# Patient Record
Sex: Female | Born: 1980
Health system: Southern US, Community
[De-identification: ages and names within clinical notes are randomized; demographics above are authoritative.]

## PROBLEM LIST (undated history)

## (undated) DIAGNOSIS — J302 Other seasonal allergic rhinitis: Secondary | ICD-10-CM

## (undated) DIAGNOSIS — I9789 Other postprocedural complications and disorders of the circulatory system, not elsewhere classified: Secondary | ICD-10-CM

## (undated) DIAGNOSIS — Z9289 Personal history of other medical treatment: Secondary | ICD-10-CM

## (undated) DIAGNOSIS — Z952 Presence of prosthetic heart valve: Secondary | ICD-10-CM

## (undated) DIAGNOSIS — R030 Elevated blood-pressure reading, without diagnosis of hypertension: Secondary | ICD-10-CM

## (undated) DIAGNOSIS — I091 Rheumatic diseases of endocardium, valve unspecified: Secondary | ICD-10-CM

## (undated) DIAGNOSIS — I4891 Unspecified atrial fibrillation: Secondary | ICD-10-CM

## (undated) HISTORY — DX: Personal history of other medical treatment: Z92.89

## (undated) HISTORY — DX: Unspecified atrial fibrillation: I48.91

## (undated) HISTORY — DX: Other seasonal allergic rhinitis: J30.2

## (undated) HISTORY — DX: Rheumatic diseases of endocardium, valve unspecified: I09.1

## (undated) HISTORY — DX: Unspecified atrial fibrillation: I97.89

## (undated) HISTORY — DX: Elevated blood-pressure reading, without diagnosis of hypertension: R03.0

## (undated) HISTORY — PX: TRANSTHORACIC ECHOCARDIOGRAM: SHX275

---
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
## 2006-11-30 ENCOUNTER — Ambulatory Visit (HOSPITAL_COMMUNITY): Admission: RE | Admit: 2006-11-30 | Discharge: 2006-11-30 | Payer: Self-pay | Admitting: Obstetrics and Gynecology

## 2006-12-27 ENCOUNTER — Ambulatory Visit (HOSPITAL_COMMUNITY): Admission: RE | Admit: 2006-12-27 | Discharge: 2006-12-27 | Payer: Self-pay | Admitting: Obstetrics and Gynecology

## 2007-02-14 ENCOUNTER — Ambulatory Visit (HOSPITAL_COMMUNITY): Admission: RE | Admit: 2007-02-14 | Discharge: 2007-02-14 | Payer: Self-pay | Admitting: Obstetrics and Gynecology

## 2007-03-14 ENCOUNTER — Ambulatory Visit (HOSPITAL_COMMUNITY): Admission: RE | Admit: 2007-03-14 | Discharge: 2007-03-14 | Payer: Self-pay | Admitting: Obstetrics and Gynecology

## 2008-05-15 ENCOUNTER — Ambulatory Visit (HOSPITAL_COMMUNITY): Admission: RE | Admit: 2008-05-15 | Discharge: 2008-05-15 | Payer: Self-pay | Admitting: Obstetrics and Gynecology

## 2008-09-14 DIAGNOSIS — Z952 Presence of prosthetic heart valve: Secondary | ICD-10-CM

## 2008-09-14 HISTORY — DX: Presence of prosthetic heart valve: Z95.2

## 2008-10-09 ENCOUNTER — Inpatient Hospital Stay (HOSPITAL_COMMUNITY): Admission: AD | Admit: 2008-10-09 | Discharge: 2008-10-09 | Payer: Self-pay | Admitting: Obstetrics and Gynecology

## 2008-10-21 ENCOUNTER — Inpatient Hospital Stay (HOSPITAL_COMMUNITY): Admission: AD | Admit: 2008-10-21 | Discharge: 2008-10-24 | Payer: Self-pay | Admitting: Obstetrics and Gynecology

## 2008-10-22 ENCOUNTER — Encounter (INDEPENDENT_AMBULATORY_CARE_PROVIDER_SITE_OTHER): Payer: Self-pay | Admitting: Internal Medicine

## 2008-10-26 ENCOUNTER — Encounter: Payer: Self-pay | Admitting: Obstetrics and Gynecology

## 2008-10-26 ENCOUNTER — Observation Stay (HOSPITAL_COMMUNITY): Admission: AD | Admit: 2008-10-26 | Discharge: 2008-10-27 | Payer: Self-pay | Admitting: Obstetrics and Gynecology

## 2008-11-01 ENCOUNTER — Encounter: Payer: Self-pay | Admitting: Obstetrics and Gynecology

## 2008-11-01 ENCOUNTER — Ambulatory Visit (HOSPITAL_COMMUNITY): Admission: RE | Admit: 2008-11-01 | Discharge: 2008-11-01 | Payer: Self-pay | Admitting: Obstetrics and Gynecology

## 2009-01-07 ENCOUNTER — Encounter (INDEPENDENT_AMBULATORY_CARE_PROVIDER_SITE_OTHER): Payer: Self-pay | Admitting: Cardiology

## 2009-01-07 ENCOUNTER — Ambulatory Visit (HOSPITAL_COMMUNITY): Admission: RE | Admit: 2009-01-07 | Discharge: 2009-01-07 | Payer: Self-pay | Admitting: Cardiology

## 2009-02-04 ENCOUNTER — Ambulatory Visit: Payer: Self-pay | Admitting: Thoracic Surgery (Cardiothoracic Vascular Surgery)

## 2009-02-12 HISTORY — PX: CARDIAC CATHETERIZATION: SHX172

## 2009-02-18 ENCOUNTER — Ambulatory Visit (HOSPITAL_COMMUNITY): Admission: RE | Admit: 2009-02-18 | Discharge: 2009-02-18 | Payer: Self-pay | Admitting: Cardiology

## 2009-02-25 ENCOUNTER — Ambulatory Visit: Payer: Self-pay | Admitting: Thoracic Surgery (Cardiothoracic Vascular Surgery)

## 2009-03-14 HISTORY — PX: OTHER SURGICAL HISTORY: SHX169

## 2009-03-25 ENCOUNTER — Ambulatory Visit: Payer: Self-pay | Admitting: Thoracic Surgery (Cardiothoracic Vascular Surgery)

## 2009-03-26 ENCOUNTER — Encounter: Payer: Self-pay | Admitting: Thoracic Surgery (Cardiothoracic Vascular Surgery)

## 2009-03-26 ENCOUNTER — Inpatient Hospital Stay (HOSPITAL_COMMUNITY)
Admission: RE | Admit: 2009-03-26 | Discharge: 2009-04-02 | Payer: Self-pay | Admitting: Thoracic Surgery (Cardiothoracic Vascular Surgery)

## 2009-03-26 ENCOUNTER — Ambulatory Visit: Payer: Self-pay | Admitting: Thoracic Surgery (Cardiothoracic Vascular Surgery)

## 2009-04-03 ENCOUNTER — Inpatient Hospital Stay (HOSPITAL_COMMUNITY): Admission: EM | Admit: 2009-04-03 | Discharge: 2009-04-05 | Payer: Self-pay | Admitting: Emergency Medicine

## 2009-04-15 ENCOUNTER — Ambulatory Visit: Payer: Self-pay | Admitting: Thoracic Surgery (Cardiothoracic Vascular Surgery)

## 2009-04-15 ENCOUNTER — Encounter
Admission: RE | Admit: 2009-04-15 | Discharge: 2009-04-15 | Payer: Self-pay | Admitting: Thoracic Surgery (Cardiothoracic Vascular Surgery)

## 2009-06-24 ENCOUNTER — Ambulatory Visit: Payer: Self-pay | Admitting: Thoracic Surgery (Cardiothoracic Vascular Surgery)

## 2009-12-17 ENCOUNTER — Ambulatory Visit: Payer: Self-pay | Admitting: Oncology

## 2010-12-21 LAB — POCT I-STAT 4, (NA,K, GLUC, HGB,HCT)
Glucose, Bld: 106 mg/dL — ABNORMAL HIGH (ref 70–99)
Glucose, Bld: 107 mg/dL — ABNORMAL HIGH (ref 70–99)
Glucose, Bld: 126 mg/dL — ABNORMAL HIGH (ref 70–99)
Glucose, Bld: 144 mg/dL — ABNORMAL HIGH (ref 70–99)
Glucose, Bld: 236 mg/dL — ABNORMAL HIGH (ref 70–99)
Glucose, Bld: 81 mg/dL (ref 70–99)
Glucose, Bld: 85 mg/dL (ref 70–99)
Glucose, Bld: 87 mg/dL (ref 70–99)
Glucose, Bld: 88 mg/dL (ref 70–99)
HCT: 20 % — ABNORMAL LOW (ref 36.0–46.0)
HCT: 22 % — ABNORMAL LOW (ref 36.0–46.0)
HCT: 23 % — ABNORMAL LOW (ref 36.0–46.0)
HCT: 23 % — ABNORMAL LOW (ref 36.0–46.0)
HCT: 32 % — ABNORMAL LOW (ref 36.0–46.0)
HCT: 34 % — ABNORMAL LOW (ref 36.0–46.0)
HCT: 38 % (ref 36.0–46.0)
Hemoglobin: 10.9 g/dL — ABNORMAL LOW (ref 12.0–15.0)
Hemoglobin: 11.6 g/dL — ABNORMAL LOW (ref 12.0–15.0)
Hemoglobin: 14.3 g/dL (ref 12.0–15.0)
Hemoglobin: 6.8 g/dL — CL (ref 12.0–15.0)
Hemoglobin: 6.8 g/dL — CL (ref 12.0–15.0)
Hemoglobin: 7.8 g/dL — CL (ref 12.0–15.0)
Hemoglobin: 7.8 g/dL — CL (ref 12.0–15.0)
Potassium: 2.6 mEq/L — CL (ref 3.5–5.1)
Potassium: 3 mEq/L — ABNORMAL LOW (ref 3.5–5.1)
Potassium: 3.1 mEq/L — ABNORMAL LOW (ref 3.5–5.1)
Potassium: 3.2 mEq/L — ABNORMAL LOW (ref 3.5–5.1)
Potassium: 3.7 mEq/L (ref 3.5–5.1)
Potassium: 3.8 mEq/L (ref 3.5–5.1)
Potassium: 3.8 mEq/L (ref 3.5–5.1)
Potassium: 4.5 mEq/L (ref 3.5–5.1)
Potassium: 5 mEq/L (ref 3.5–5.1)
Potassium: 5.1 mEq/L (ref 3.5–5.1)
Sodium: 136 mEq/L (ref 135–145)
Sodium: 138 mEq/L (ref 135–145)
Sodium: 140 mEq/L (ref 135–145)
Sodium: 140 mEq/L (ref 135–145)
Sodium: 144 mEq/L (ref 135–145)
Sodium: 145 mEq/L (ref 135–145)

## 2010-12-21 LAB — URINALYSIS, DIPSTICK ONLY
Bilirubin Urine: NEGATIVE
Glucose, UA: NEGATIVE mg/dL
Hgb urine dipstick: NEGATIVE
Specific Gravity, Urine: 1.014 (ref 1.005–1.030)
Urobilinogen, UA: >8 mg/dL — ABNORMAL HIGH (ref 0.0–1.0)
pH: 7 (ref 5.0–8.0)

## 2010-12-21 LAB — CBC
HCT: 31.3 % — ABNORMAL LOW (ref 36.0–46.0)
HCT: 35 % — ABNORMAL LOW (ref 36.0–46.0)
HCT: 35.7 % — ABNORMAL LOW (ref 36.0–46.0)
HCT: 10.2 % — ABNORMAL LOW (ref 36.0–46.0)
HCT: 24.2 % — ABNORMAL LOW (ref 36.0–46.0)
HCT: 25.6 % — ABNORMAL LOW (ref 36.0–46.0)
HCT: 34.3 % — ABNORMAL LOW (ref 36.0–46.0)
HCT: 38.4 % (ref 36.0–46.0)
HCT: 39.7 % (ref 36.0–46.0)
Hemoglobin: 10.8 g/dL — ABNORMAL LOW (ref 12.0–15.0)
Hemoglobin: 12.1 g/dL (ref 12.0–15.0)
Hemoglobin: 12.1 g/dL (ref 12.0–15.0)
Hemoglobin: 12.6 g/dL (ref 12.0–15.0)
Hemoglobin: 12.7 g/dL (ref 12.0–15.0)
Hemoglobin: 13.4 g/dL (ref 12.0–15.0)
Hemoglobin: 3.6 g/dL — CL (ref 12.0–15.0)
Hemoglobin: 8.4 g/dL — ABNORMAL LOW (ref 12.0–15.0)
Hemoglobin: 9 g/dL — ABNORMAL LOW (ref 12.0–15.0)
MCHC: 33.9 g/dL (ref 30.0–36.0)
MCHC: 34.3 g/dL (ref 30.0–36.0)
MCHC: 34.5 g/dL (ref 30.0–36.0)
MCHC: 34.7 g/dL (ref 30.0–36.0)
MCHC: 35.1 g/dL (ref 30.0–36.0)
MCHC: 35.2 g/dL (ref 30.0–36.0)
MCV: 85.9 fL (ref 78.0–100.0)
MCV: 86.1 fL (ref 78.0–100.0)
MCV: 85 fL (ref 78.0–100.0)
MCV: 85.2 fL (ref 78.0–100.0)
MCV: 86.6 fL (ref 78.0–100.0)
Platelets: 69 10*3/uL — ABNORMAL LOW (ref 150–400)
Platelets: 111 10*3/uL — ABNORMAL LOW (ref 150–400)
Platelets: 166 10*3/uL (ref 150–400)
Platelets: 88 10*3/uL — ABNORMAL LOW (ref 150–400)
Platelets: 98 10*3/uL — ABNORMAL LOW (ref 150–400)
RBC: 4.07 MIL/uL (ref 3.87–5.11)
RBC: 4.1 MIL/uL (ref 3.87–5.11)
RBC: 4.12 MIL/uL (ref 3.87–5.11)
RBC: 2.84 MIL/uL — ABNORMAL LOW (ref 3.87–5.11)
RBC: 3.01 MIL/uL — ABNORMAL LOW (ref 3.87–5.11)
RBC: 4.02 MIL/uL (ref 3.87–5.11)
RBC: 4.36 MIL/uL (ref 3.87–5.11)
RBC: 4.69 MIL/uL (ref 3.87–5.11)
RDW: 16.6 % — ABNORMAL HIGH (ref 11.5–15.5)
RDW: 15.5 % (ref 11.5–15.5)
RDW: 15.8 % — ABNORMAL HIGH (ref 11.5–15.5)
RDW: 16.2 % — ABNORMAL HIGH (ref 11.5–15.5)
RDW: 17.3 % — ABNORMAL HIGH (ref 11.5–15.5)
RDW: 17.4 % — ABNORMAL HIGH (ref 11.5–15.5)
WBC: 6.2 10*3/uL (ref 4.0–10.5)
WBC: 7.7 10*3/uL (ref 4.0–10.5)
WBC: 8 10*3/uL (ref 4.0–10.5)
WBC: 14.3 10*3/uL — ABNORMAL HIGH (ref 4.0–10.5)
WBC: 4.6 10*3/uL (ref 4.0–10.5)

## 2010-12-21 LAB — POCT I-STAT 3, ART BLOOD GAS (G3+)
Acid-base deficit: 1 mmol/L (ref 0.0–2.0)
Acid-base deficit: 1 mmol/L (ref 0.0–2.0)
Acid-base deficit: 2 mmol/L (ref 0.0–2.0)
Acid-base deficit: 3 mmol/L — ABNORMAL HIGH (ref 0.0–2.0)
Acid-base deficit: 4 mmol/L — ABNORMAL HIGH (ref 0.0–2.0)
Bicarbonate: 21.4 mEq/L (ref 20.0–24.0)
Bicarbonate: 23.1 mEq/L (ref 20.0–24.0)
Bicarbonate: 24.4 mEq/L — ABNORMAL HIGH (ref 20.0–24.0)
Bicarbonate: 25.1 mEq/L — ABNORMAL HIGH (ref 20.0–24.0)
Bicarbonate: 25.1 mEq/L — ABNORMAL HIGH (ref 20.0–24.0)
Bicarbonate: 25.2 mEq/L — ABNORMAL HIGH (ref 20.0–24.0)
O2 Saturation: 100 %
O2 Saturation: 100 %
O2 Saturation: 100 %
O2 Saturation: 100 %
O2 Saturation: 97 %
O2 Saturation: 99 %
O2 Saturation: 99 %
Patient temperature: 37.7
TCO2: 23 mmol/L (ref 0–100)
TCO2: 24 mmol/L (ref 0–100)
TCO2: 24 mmol/L (ref 0–100)
TCO2: 26 mmol/L (ref 0–100)
TCO2: 26 mmol/L (ref 0–100)
TCO2: 27 mmol/L (ref 0–100)
pCO2 arterial: 37.7 mmHg (ref 35.0–45.0)
pCO2 arterial: 38 mmHg (ref 35.0–45.0)
pCO2 arterial: 47.1 mmHg — ABNORMAL HIGH (ref 35.0–45.0)
pCO2 arterial: 47.2 mmHg — ABNORMAL HIGH (ref 35.0–45.0)
pCO2 arterial: 47.7 mmHg — ABNORMAL HIGH (ref 35.0–45.0)
pCO2 arterial: 49.8 mmHg — ABNORMAL HIGH (ref 35.0–45.0)
pH, Arterial: 7.317 — ABNORMAL LOW (ref 7.350–7.400)
pH, Arterial: 7.325 — ABNORMAL LOW (ref 7.350–7.400)
pH, Arterial: 7.329 — ABNORMAL LOW (ref 7.350–7.400)
pH, Arterial: 7.352 (ref 7.350–7.400)
pO2, Arterial: 120 mmHg — ABNORMAL HIGH (ref 80.0–100.0)
pO2, Arterial: 157 mmHg — ABNORMAL HIGH (ref 80.0–100.0)
pO2, Arterial: 161 mmHg — ABNORMAL HIGH (ref 80.0–100.0)
pO2, Arterial: 238 mmHg — ABNORMAL HIGH (ref 80.0–100.0)
pO2, Arterial: 337 mmHg — ABNORMAL HIGH (ref 80.0–100.0)
pO2, Arterial: 351 mmHg — ABNORMAL HIGH (ref 80.0–100.0)
pO2, Arterial: 97 mmHg (ref 80.0–100.0)

## 2010-12-21 LAB — BASIC METABOLIC PANEL
BUN: 10 mg/dL (ref 6–23)
BUN: 11 mg/dL (ref 6–23)
BUN: 12 mg/dL (ref 6–23)
CO2: 26 mEq/L (ref 19–32)
CO2: 30 mEq/L (ref 19–32)
CO2: 32 mEq/L (ref 19–32)
Calcium: 8.3 mg/dL — ABNORMAL LOW (ref 8.4–10.5)
Calcium: 8.6 mg/dL (ref 8.4–10.5)
Calcium: 9.1 mg/dL (ref 8.4–10.5)
Calcium: 9.1 mg/dL (ref 8.4–10.5)
Calcium: 7.8 mg/dL — ABNORMAL LOW (ref 8.4–10.5)
Chloride: 96 mEq/L (ref 96–112)
Chloride: 97 mEq/L (ref 96–112)
Chloride: 101 mEq/L (ref 96–112)
Creatinine, Ser: 0.5 mg/dL (ref 0.4–1.2)
Creatinine, Ser: 0.7 mg/dL (ref 0.4–1.2)
Creatinine, Ser: 0.61 mg/dL (ref 0.4–1.2)
Creatinine, Ser: 0.68 mg/dL (ref 0.4–1.2)
GFR calc Af Amer: >60 mL/min (ref >60)
GFR calc Af Amer: >60 mL/min (ref >60)
GFR calc Af Amer: >60 mL/min (ref >60)
GFR calc Af Amer: >60 mL/min (ref >60)
GFR calc Af Amer: >60 mL/min (ref >60)
GFR calc Af Amer: >60 mL/min (ref >60)
GFR calc non Af Amer: >60 mL/min (ref >60)
GFR calc non Af Amer: >60 mL/min (ref >60)
GFR calc non Af Amer: >60 mL/min (ref >60)
GFR calc non Af Amer: >60 mL/min (ref >60)
GFR calc non Af Amer: >60 mL/min (ref >60)
Glucose, Bld: 116 mg/dL — ABNORMAL HIGH (ref 70–99)
Glucose, Bld: 119 mg/dL — ABNORMAL HIGH (ref 70–99)
Glucose, Bld: 121 mg/dL — ABNORMAL HIGH (ref 70–99)
Potassium: 3.4 mEq/L — ABNORMAL LOW (ref 3.5–5.1)
Potassium: 3.6 mEq/L (ref 3.5–5.1)
Potassium: 3.7 mEq/L (ref 3.5–5.1)
Sodium: 133 mEq/L — ABNORMAL LOW (ref 135–145)
Sodium: 135 mEq/L (ref 135–145)
Sodium: 136 mEq/L (ref 135–145)
Sodium: 138 mEq/L (ref 135–145)

## 2010-12-21 LAB — BLOOD GAS, ARTERIAL
Drawn by: 181601
Patient temperature: 98.6
pCO2 arterial: 37.4 mmHg (ref 35.0–45.0)
pH, Arterial: 7.382 (ref 7.350–7.400)

## 2010-12-21 LAB — BRAIN NATRIURETIC PEPTIDE: Pro B Natriuretic peptide (BNP): 541 pg/mL — ABNORMAL HIGH (ref 0.0–100.0)

## 2010-12-21 LAB — PREPARE PLATELETS

## 2010-12-21 LAB — POCT I-STAT, CHEM 8
BUN: 14 mg/dL (ref 6–23)
Calcium, Ion: 1.1 mmol/L — ABNORMAL LOW (ref 1.12–1.32)
HCT: 35 % — ABNORMAL LOW (ref 36.0–46.0)
Hemoglobin: 11.9 g/dL — ABNORMAL LOW (ref 12.0–15.0)
Potassium: 3.7 mEq/L (ref 3.5–5.1)
Sodium: 142 mEq/L (ref 135–145)
TCO2: 20 mmol/L (ref 0–100)

## 2010-12-21 LAB — PREPARE FRESH FROZEN PLASMA

## 2010-12-21 LAB — GLUCOSE, CAPILLARY
Glucose-Capillary: 116 mg/dL — ABNORMAL HIGH (ref 70–99)
Glucose-Capillary: 90 mg/dL (ref 70–99)
Glucose-Capillary: 103 mg/dL — ABNORMAL HIGH (ref 70–99)
Glucose-Capillary: 109 mg/dL — ABNORMAL HIGH (ref 70–99)
Glucose-Capillary: 109 mg/dL — ABNORMAL HIGH (ref 70–99)
Glucose-Capillary: 120 mg/dL — ABNORMAL HIGH (ref 70–99)
Glucose-Capillary: 133 mg/dL — ABNORMAL HIGH (ref 70–99)
Glucose-Capillary: 140 mg/dL — ABNORMAL HIGH (ref 70–99)
Glucose-Capillary: 142 mg/dL — ABNORMAL HIGH (ref 70–99)

## 2010-12-21 LAB — CULTURE, BLOOD (ROUTINE X 2)
Culture: NO GROWTH
Culture: NO GROWTH

## 2010-12-21 LAB — PROTIME-INR
INR: 1.1 (ref 0.00–1.49)
INR: 1.2 (ref 0.00–1.49)
INR: 1.5 (ref 0.00–1.49)
INR: 1.5 (ref 0.00–1.49)
INR: 1.6 — ABNORMAL HIGH (ref 0.00–1.49)
INR: 1 (ref 0.00–1.49)
INR: 1.3 (ref 0.00–1.49)
INR: 1.5 (ref 0.00–1.49)
Prothrombin Time: 14.6 seconds (ref 11.6–15.2)
Prothrombin Time: 14.8 seconds (ref 11.6–15.2)
Prothrombin Time: 15.8 seconds — ABNORMAL HIGH (ref 11.6–15.2)
Prothrombin Time: 17.7 seconds — ABNORMAL HIGH (ref 11.6–15.2)
Prothrombin Time: 19.8 seconds — ABNORMAL HIGH (ref 11.6–15.2)
Prothrombin Time: 19.2 seconds — ABNORMAL HIGH (ref 11.6–15.2)

## 2010-12-21 LAB — TYPE AND SCREEN
ABO/RH(D): B POS
Antibody Screen: NEGATIVE

## 2010-12-21 LAB — COMPREHENSIVE METABOLIC PANEL
ALT: 13 U/L (ref 0–35)
AST: 15 U/L (ref 0–37)
Albumin: 4 g/dL (ref 3.5–5.2)
Alkaline Phosphatase: 52 U/L (ref 39–117)
BUN: 15 mg/dL (ref 6–23)
GFR calc Af Amer: >60 mL/min (ref >60)
Potassium: 4.1 mEq/L (ref 3.5–5.1)
Sodium: 136 mEq/L (ref 135–145)
Total Protein: 7.2 g/dL (ref 6.0–8.3)

## 2010-12-21 LAB — HEMOGLOBIN A1C
Hgb A1c MFr Bld: 5.3 % (ref 4.6–6.1)
Mean Plasma Glucose: 105 mg/dL

## 2010-12-21 LAB — CREATININE, SERUM
Creatinine, Ser: 0.67 mg/dL (ref 0.4–1.2)
GFR calc non Af Amer: >60 mL/min (ref >60)

## 2010-12-21 LAB — POCT I-STAT 3, VENOUS BLOOD GAS (G3P V)
Acid-base deficit: 3 mmol/L — ABNORMAL HIGH (ref 0.0–2.0)
Acid-base deficit: 3 mmol/L — ABNORMAL HIGH (ref 0.0–2.0)
Bicarbonate: 24 mEq/L (ref 20.0–24.0)
O2 Saturation: 75 %
O2 Saturation: 82 %
TCO2: 26 mmol/L (ref 0–100)
TCO2: 26 mmol/L (ref 0–100)
pO2, Ven: 47 mmHg — ABNORMAL HIGH (ref 30.0–45.0)
pO2, Ven: 53 mmHg — ABNORMAL HIGH (ref 30.0–45.0)

## 2010-12-21 LAB — URINE CULTURE

## 2010-12-21 LAB — URINALYSIS, ROUTINE W REFLEX MICROSCOPIC
Hgb urine dipstick: NEGATIVE
Specific Gravity, Urine: 1.012 (ref 1.005–1.030)
Urobilinogen, UA: 0.2 mg/dL (ref 0.0–1.0)

## 2010-12-21 LAB — DIFFERENTIAL
Basophils Absolute: 0 10*3/uL (ref 0.0–0.1)
Basophils Relative: 0 % (ref 0–1)
Lymphocytes Relative: 7 % — ABNORMAL LOW (ref 12–46)
Monocytes Absolute: 0.9 10*3/uL (ref 0.1–1.0)
Neutro Abs: 7.1 10*3/uL (ref 1.7–7.7)
Neutrophils Relative %: 83 % — ABNORMAL HIGH (ref 43–77)

## 2010-12-21 LAB — CK TOTAL AND CKMB (NOT AT ARMC)
CK, MB: 1.9 ng/mL (ref 0.3–4.0)
Relative Index: INVALID (ref 0.0–2.5)
Total CK: 54 U/L (ref 7–177)

## 2010-12-21 LAB — MAGNESIUM
Magnesium: 1.9 mg/dL (ref 1.5–2.5)
Magnesium: 2.2 mg/dL (ref 1.5–2.5)
Magnesium: 1.7 mg/dL (ref 1.5–2.5)
Magnesium: 1.9 mg/dL (ref 1.5–2.5)

## 2010-12-21 LAB — ABO/RH: ABO/RH(D): B POS

## 2010-12-21 LAB — HEMOGLOBIN AND HEMATOCRIT, BLOOD
HCT: 24.8 % — ABNORMAL LOW (ref 36.0–46.0)
Hemoglobin: 8.4 g/dL — ABNORMAL LOW (ref 12.0–15.0)

## 2010-12-21 LAB — APTT
aPTT: 41 seconds — ABNORMAL HIGH (ref 24–37)
aPTT: 45 seconds — ABNORMAL HIGH (ref 24–37)
aPTT: 59 seconds — ABNORMAL HIGH (ref 24–37)

## 2010-12-21 LAB — MRSA CULTURE

## 2010-12-21 LAB — PLATELET COUNT: Platelets: 87 10*3/uL — ABNORMAL LOW (ref 150–400)

## 2010-12-22 LAB — HCG, SERUM, QUALITATIVE: Preg, Serum: NEGATIVE

## 2010-12-22 LAB — POCT I-STAT 3, ART BLOOD GAS (G3+)
Acid-base deficit: 1 mmol/L (ref 0.0–2.0)
Bicarbonate: 23.6 mEq/L (ref 20.0–24.0)
pCO2 arterial: 39.7 mmHg (ref 35.0–45.0)
pH, Arterial: 7.382 (ref 7.350–7.400)
pO2, Arterial: 85 mmHg (ref 80.0–100.0)

## 2010-12-22 LAB — POCT I-STAT 3, VENOUS BLOOD GAS (G3P V)
Bicarbonate: 22.3 mEq/L (ref 20.0–24.0)
pH, Ven: 7.297 (ref 7.250–7.300)

## 2010-12-30 LAB — CBC
HCT: 32.5 % — ABNORMAL LOW (ref 36.0–46.0)
HCT: 35.7 % — ABNORMAL LOW (ref 36.0–46.0)
HCT: 36.4 % (ref 36.0–46.0)
HCT: 36.8 % (ref 36.0–46.0)
HCT: 37 % (ref 36.0–46.0)
HCT: 40.6 % (ref 36.0–46.0)
HCT: 32.3 % — ABNORMAL LOW (ref 36.0–46.0)
Hemoglobin: 11 g/dL — ABNORMAL LOW (ref 12.0–15.0)
Hemoglobin: 11.9 g/dL — ABNORMAL LOW (ref 12.0–15.0)
Hemoglobin: 12.2 g/dL (ref 12.0–15.0)
Hemoglobin: 12.4 g/dL (ref 12.0–15.0)
Hemoglobin: 12.4 g/dL (ref 12.0–15.0)
Hemoglobin: 13.7 g/dL (ref 12.0–15.0)
MCHC: 33.4 g/dL (ref 30.0–36.0)
MCHC: 33.7 g/dL (ref 30.0–36.0)
MCHC: 33.7 g/dL (ref 30.0–36.0)
MCV: 90.8 fL (ref 78.0–100.0)
MCV: 90.8 fL (ref 78.0–100.0)
MCV: 91 fL (ref 78.0–100.0)
MCV: 90.4 fL (ref 78.0–100.0)
Platelets: 121 10*3/uL — ABNORMAL LOW (ref 150–400)
Platelets: 126 10*3/uL — ABNORMAL LOW (ref 150–400)
Platelets: 177 10*3/uL (ref 150–400)
Platelets: 206 10*3/uL (ref 150–400)
Platelets: 236 10*3/uL (ref 150–400)
RBC: 3.99 MIL/uL (ref 3.87–5.11)
RBC: 4.47 MIL/uL (ref 3.87–5.11)
RBC: 3.57 MIL/uL — ABNORMAL LOW (ref 3.87–5.11)
RDW: 14.3 % (ref 11.5–15.5)
RDW: 14.4 % (ref 11.5–15.5)
RDW: 14.5 % (ref 11.5–15.5)
RDW: 14.5 % (ref 11.5–15.5)
RDW: 14.6 % (ref 11.5–15.5)
WBC: 13 10*3/uL — ABNORMAL HIGH (ref 4.0–10.5)
WBC: 9.1 10*3/uL (ref 4.0–10.5)
WBC: 9.6 10*3/uL (ref 4.0–10.5)
WBC: 5.7 10*3/uL (ref 4.0–10.5)

## 2010-12-30 LAB — URINE MICROSCOPIC-ADD ON

## 2010-12-30 LAB — BASIC METABOLIC PANEL
BUN: 23 mg/dL (ref 6–23)
Chloride: 108 mEq/L (ref 96–112)
Glucose, Bld: 85 mg/dL (ref 70–99)
Potassium: 3.5 mEq/L (ref 3.5–5.1)

## 2010-12-30 LAB — COMPREHENSIVE METABOLIC PANEL
ALT: 57 U/L — ABNORMAL HIGH (ref 0–35)
AST: 28 U/L (ref 0–37)
AST: 39 U/L — ABNORMAL HIGH (ref 0–37)
Albumin: 2.5 g/dL — ABNORMAL LOW (ref 3.5–5.2)
Albumin: 2.8 g/dL — ABNORMAL LOW (ref 3.5–5.2)
Alkaline Phosphatase: 114 U/L (ref 39–117)
Alkaline Phosphatase: 146 U/L — ABNORMAL HIGH (ref 39–117)
BUN: 17 mg/dL (ref 6–23)
BUN: 21 mg/dL (ref 6–23)
Calcium: 8.5 mg/dL (ref 8.4–10.5)
Creatinine, Ser: 0.74 mg/dL (ref 0.4–1.2)
GFR calc Af Amer: >60 mL/min (ref >60)
GFR calc Af Amer: >60 mL/min (ref >60)
Glucose, Bld: 82 mg/dL (ref 70–99)
Glucose, Bld: 99 mg/dL (ref 70–99)
Potassium: 3.4 mEq/L — ABNORMAL LOW (ref 3.5–5.1)
Potassium: 4 mEq/L (ref 3.5–5.1)
Sodium: 141 mEq/L (ref 135–145)
Total Bilirubin: 0.5 mg/dL (ref 0.3–1.2)
Total Protein: 5.5 g/dL — ABNORMAL LOW (ref 6.0–8.3)
Total Protein: 5.7 g/dL — ABNORMAL LOW (ref 6.0–8.3)

## 2010-12-30 LAB — DIFFERENTIAL
Basophils Absolute: 0 10*3/uL (ref 0.0–0.1)
Basophils Relative: 0 % (ref 0–1)
Eosinophils Absolute: 0.1 10*3/uL (ref 0.0–0.7)
Lymphocytes Relative: 23 % (ref 12–46)
Lymphs Abs: 1.3 10*3/uL (ref 0.7–4.0)
Monocytes Relative: 3 % (ref 3–12)
Monocytes Relative: 9 % (ref 3–12)
Neutro Abs: 11.8 10*3/uL — ABNORMAL HIGH (ref 1.7–7.7)
Neutrophils Relative %: 86 % — ABNORMAL HIGH (ref 43–77)
Neutrophils Relative %: 66 % (ref 43–77)

## 2010-12-30 LAB — TYPE AND SCREEN
ABO/RH(D): B POS
Antibody Screen: NEGATIVE

## 2010-12-30 LAB — HEPATIC FUNCTION PANEL
ALT: 64 U/L — ABNORMAL HIGH (ref 0–35)
AST: 61 U/L — ABNORMAL HIGH (ref 0–37)
Albumin: 2.5 g/dL — ABNORMAL LOW (ref 3.5–5.2)
Total Protein: 5.6 g/dL — ABNORMAL LOW (ref 6.0–8.3)

## 2010-12-30 LAB — APTT: aPTT: 28 seconds (ref 24–37)

## 2010-12-30 LAB — CARDIAC PANEL(CRET KIN+CKTOT+MB+TROPI)
CK, MB: 4.8 ng/mL — ABNORMAL HIGH (ref 0.3–4.0)
Relative Index: 5 — ABNORMAL HIGH (ref 0.0–2.5)

## 2010-12-30 LAB — PROTIME-INR: INR: 0.9 (ref 0.00–1.49)

## 2010-12-30 LAB — HEMOGLOBIN AND HEMATOCRIT, BLOOD: HCT: 29.5 % — ABNORMAL LOW (ref 36.0–46.0)

## 2010-12-30 LAB — URINALYSIS, ROUTINE W REFLEX MICROSCOPIC
Bilirubin Urine: NEGATIVE
Nitrite: NEGATIVE
Specific Gravity, Urine: 1.01 (ref 1.005–1.030)
pH: 5.5 (ref 5.0–8.0)

## 2010-12-30 LAB — CK TOTAL AND CKMB (NOT AT ARMC)
CK, MB: 10 ng/mL — ABNORMAL HIGH (ref 0.3–4.0)
Total CK: 199 U/L — ABNORMAL HIGH (ref 7–177)

## 2011-01-27 NOTE — Discharge Summary (Signed)
NAMEMARGERY, [name]                ACCOUNT NO.:  [id number]   MEDICAL RECORD NO.:  [id number]          PATIENT TYPE:  OBV   LOCATION:  9320                          FACILITY:  [facility]   PHYSICIAN:  Pieter Partridge, MD   DATE OF BIRTH:  Jun 23, 1981   DATE OF ADMISSION:  10/26/2008  DATE OF DISCHARGE:  10/27/2008                               DISCHARGE SUMMARY   ADMITTING DIAGNOSES:  1. Postpartum hemorrhage, postpartum day #6.  2. History of pulmonary edema.  3. Preeclampsia, postpartum.  4. Cardiac valvular dysfunction.   PROCEDURES:  1. Removal of products of conception.  2. Ultrasound.   HOSPITAL COURSE:  [name] was admitted after she experienced bleeding  on postpartum day #6.  For 6 hours, she reported passing large golf ball  size clots at home and continues to have heavy vaginal bleeding, soaking  3 pads in an hour.  Upon arrival, she received Cytotec 800 mcg per  rectum.  She then had a formal ultrasound which showed a lower uterine  segment tissue suspicious for products of conception.  She then returned  to [facility] where sterile vaginal exam was performed and it  was noted that there was a large clot at the vault and I removed some  tannedthickened tissue that resembled residual parts of placenta and  that was sent to pathology.  She was not given any Methergine.  Her  hematocrit was repeated; on arrival it was 12.2, approximately 4 hours  after admission it was 11.2, and at the morning of discharge  approximately 8 hours later it was 10.2.   The patient reported dizziness, but no shortness of breath or chest pain  upon arrival.  Her husband and the patient felt that she was pale.   Overnight, a pad count was done.  She used approximately 3 pads in 12  hours which were minimally soiled, no more clot.  She remained afebrile.  Her vital sings were within normal limits.  Bedside ultrasound was  performed and the clot or the debris was significantly less and  had  gone.  Her cervix was not closed and bleeding was significantly less.  The patient's dizziness resolved.   Due to her difficulty after her present delivery and questionable hard  issues, I did not do a D&C because I thought that maybe pulling  everything out would expose her to the risk of surgery unnecessarily.  After observation, her bleeding has decreased significantly to a normal  postpartum bleeding.  The plan was if she continue to bleed or if tissue  was significant, the next day then just do D&C; however, the patient has  improved significantly.   MEDICATIONS:  She is to go home with Cytotec 200 mcg.  Also, she was  given doxycycline 100 mg b.i.d. which she received upon admission.  Medications to go home with, doxycycline 100 mg p.o. b.i.d. x3 days and  Cytotec 200 mcg p.o. q.6 h. x24 hours.  She is to continue her home  meds.   ACTIVITY:  Pelvic rest and increase activity slowly.   DIET:  No  restrictions.   FOLLOWUP:  She is to follow up at [facility] on October 31, 2008.      Pieter Partridge, MD  Electronically Signed     EBV/MEDQ  D:  10/27/2008  T:  10/28/2008  Job:  [id number]

## 2011-01-27 NOTE — Discharge Summary (Signed)
[name], ALPER                ACCOUNT NO.:  [id number]   MEDICAL RECORD NO.:  [id number]          PATIENT TYPE:  INP   LOCATION:  2923                         FACILITY:  [facility]   PHYSICIAN:  Sheliah Mends, MD      DATE OF BIRTH:  [birth year]   DATE OF ADMISSION:  04/03/2009  DATE OF DISCHARGE:  04/05/2009                               DISCHARGE SUMMARY   ADDENDUM   Please note on reviewing her chest x-ray, PA and lateral, prior to  discharge, continued mild low-grade fever at 99, we will add Zithromax  250 mg 2 when she gets home and then 1 a day until gone for a week.  Also because I am adding Zithromax already to amiodarone, I will also  decrease the Coumadin.  She received 10 mg on April 05, 2009.  She will  now take 5 mg on Saturday, April 06, 2009; and 5 mg April 07, 2009; and  Monday according to Coumadin clinic.      [name]. Annie Paras, N.P.      Sheliah Mends, MD  Electronically Signed    LRI/MEDQ  D:  04/05/2009  T:  04/06/2009  Job:  [id number]   cc:   Salvatore Decent. Cornelius Moras, M.D.  Jackie Plum, M.D.

## 2011-01-27 NOTE — Assessment & Plan Note (Signed)
OFFICE VISIT   [name], KOOK T  DOB:  1980-11-15                                        April 15, 2009  CHART #:  [id number]   HISTORY OF PRESENT ILLNESS:  The patient returns to the office today for  routine followup status post aortic and mitral valve replacement using  mechanical valves for rheumatic valvular heart disease on March 26, 2009.  Her operation was initially approached through a minimally invasive  approach, but it required conversion to a full sternotomy  intraoperatively.  Postoperatively, she did well initially and was  discharged home uneventfully.  She was readmitted to the hospital with a  low-grade fever, cough, and rapid atrial fibrillation.  Her fever and  cough were treated with antibiotics and atrial fibrillation was treated  with amiodarone.  She was discharged home uneventfully after converting  back to sinus rhythm and since then she has done quite well.  She  reports that she is feeling much better.  She has mild residual soreness  in her chest.  She has not had any further fevers or chills.  Her cough  had resolved completely, although she states that she did have a little  bit of a dry cough yesterday.  She has no shortness of breath.  She is  sleeping well.  Her appetite is good.  She has no other complaints.  A  prothrombin time has been checked with the [facility], and she states that her most recent INR was measured  3.2.  The remainder of her review of systems are unremarkable.   PHYSICAL EXAMINATION:  General:  Well-appearing female.  Vital Signs:  Blood pressure 120/85, pulse 90 and regular, oxygen saturation 100% on  room air.  Chest:  Median sternotomy incision is healing nicely as is the miniature  anterior thoracotomy incision.  There is a slight area of drainage where  the 2 incisions touch in the midline, but this appears to be healing  well.  There is no associated erythema.  The  sternum is stable.  Breath  sounds are clear to auscultation and symmetrical bilaterally.  No  wheezes or rhonchi noted.  Cardiovascular:  Regular rate and rhythm with  obviously apparent mechanical heart valve sounds.  No murmurs, rubs, or  gallops are noted.  Abdomen:  Soft, nontender.  Extremities:  Warm and  well perfused.  There is no lower extremity edema.   DIAGNOSTICS TEST:  Chest x-ray performed today at the [facility] is reviewed.  This demonstrates clear lung fields bilaterally  with no significant pleural effusions.  All the sternal wires appear  intact.   IMPRESSION:  Satisfactory progress following recent combined aortic and  mitral valve replacement.  Veleda seems to be doing well and appears to  be maintaining sinus rhythm at this time.   PLAN:  I have encouraged [name] to continue to gradually increase her  physical activity as tolerated, although I have cautioned her to avoid  any heavy lifting or strenuous use of her arms or shoulders for at least  another 2 months.  We have not made any other suggestions for changes in  current medications.  I do think that she can stop taking aspirin now  that her prothrombin time is therapeutic on Coumadin.  We will plan to  see her back in 10 weeks for further routine followup.   Salvatore Decent. Cornelius Moras, M.D.  Electronically Signed   CHO/MEDQ  D:  04/15/2009  T:  04/16/2009  Job:  [id number]   cc:   Sheliah Mends, MD

## 2011-01-27 NOTE — Op Note (Signed)
NAMESARAHY, CREEDON                ACCOUNT NO.:  [id number]   MEDICAL RECORD NO.:  [id number]          PATIENT TYPE:  INP   LOCATION:  2314                         FACILITY:  [facility]   PHYSICIAN:  Salvatore Decent. Cornelius Moras, M.D. DATE OF BIRTH:  05-02-1981   DATE OF PROCEDURE:  03/26/2009  DATE OF DISCHARGE:                               OPERATIVE REPORT   PREOPERATIVE DIAGNOSES:  1. Moderate to severe aortic regurgitation.  2. Severe mitral regurgitation.   POSTOPERATIVE DIAGNOSES:  1. Moderate to severe aortic regurgitation.  2. Severe mitral regurgitation.   PROCEDURE:  Right miniature anterior thoracotomy with conversion to  median sternotomy for aortic valve replacement and mitral valve  replacement(21-mm Sorin [employer] Top Hat aortic and 33-mm Sorin  [employer] OptiForm mitral mechanical prostheses).   SURGEON:  Salvatore Decent. Cornelius Moras, MD   ASSISTANT:  Coral Ceo, PA   ANESTHESIA:  General.   BRIEF CLINICAL NOTE:  The patient is a 30 year old female originally  from [place] who was recently diagnosed with rheumatic aortic and mitral  valve disease.  The patient was first noted to have heart murmur on  physical exam 2 years previously.  She developed congestive heart  failure during the third trimester of her second pregnancy, which  progressed shortly after delivery of her child.  An echocardiogram was  performed demonstrating severe mitral regurgitation and moderate-to-  severe aortic regurgitation.  The patient was evaluated by Dr. Sheliah Mends and transesophageal echocardiogram was performed confirming  dilated left ventricle with severe mitral regurgitation and moderate-to-  severe aortic regurgitation.  She subsequently underwent elective  cardiac catheterization confirming the presence of normal coronary  artery anatomy and no significant coronary artery disease.  A full  consultation note has been dictated previously.  The patient now  presents for definitive surgical  intervention.  The patient has been  counseled at length regarding possible surgical alternatives.  She  understands that her valves will need to be replaced, and after  considerable discussion, she desires to have her valve replaced using  mechanical prosthesis.  She understands that this comes with need for  long-term anticoagulation and has associated long-term risks related to  both bleeding and thromboembolism.  Alternative treatment strategies  have been reviewed.  She also adamantly hopes that her valves can be  replaced using minimally invasive technique in an effort to return to  normal physical activity as soon as possible.  All of her questions have  been addressed.   OPERATIVE FINDINGS:  1. Mild left ventricular chamber enlargement with normal left      ventricular systolic function.  2. Moderate to severe aortic regurgitation with rheumatic aortic valve      disease and prolapse of the noncoronary cusp with rheumatic      thickening of the leaflet.  3. Severe mitral regurgitation related to rheumatic mitral valve      disease.  4. Bleeding from small laceration in tip of left atrial appendage      after separation from bypass requiring conversion to full open      sternotomy for repair.   OPERATIVE  NOTE IN DETAIL:  The patient was brought to the operating room  on the above-mentioned date and central monitoring was established by  the Anesthesia Team under the care and direction of Dr. Kipp Brood.  Specifically, a Swan-Ganz catheter was placed through the left internal  jugular approach.  A radial arterial line was placed.  Intravenous  antibiotics were administered.  The patient was placed in the supine  position on the operating table.  Following induction with general  endotracheal anesthesia, a Foley catheter was placed.  The patient was  initially intubated using a dual-lumen endotracheal tube.  The patient  was positioned on the table with a soft roll behind the  right scapula  and the neck gently extended and turned towards the left.  The patient's  right neck, chest, abdomen, both groins, and both lower extremities were  prepared and draped in sterile manner.   Baseline transesophageal echocardiogram was performed by Dr. Noreene Larsson.  This confirms findings consistent with rheumatic mitral and aortic valve  disease.  There was thickening and prolapse of the noncoronary cusp of  the aortic valve with moderate to severe aortic regurgitation.  The jet  of regurgitation was quite eccentric that fills the left ventricular  chamber.  There was rheumatic mitral valve disease with thickening and  restricted motion of both leaflets.  The posterior leaflet severely  restricted.  There was no significant mitral stenosis.  There was severe  mitral regurgitation.  There was mild left ventricular chamber  enlargement with normal left ventricular systolic function.   A small incision was made in the right inguinal crease.  The anterior  surface of the right common femoral artery and right common femoral vein  were dissected through the small incision and identified.  The femoral  artery was soft just above the site of previous catheterization.   Single lung ventilation was begun.  A right miniature anterior  thoracotomy was performed overlying the third intercostal space  beginning just at the right lateral margin of the sternum and extending  laterally for 6 cm.  The incision was completed through the subcutaneous  tissues and pectoralis major muscle with electrocautery.  Right pleural  space was entered through the third intercostal space.  The third costal  cartilage was amputated off the lateral border of the sternum medially  to facilitate exposure.  A soft tissue retractor was placed.  Two 11-mm  thoracoscopic port sites were placed through separate stab incisions in  the inframammary crease.  The right chest was insufflated with carbon  dioxide gas  through one of these ports continuously throughout the  remainder of the operation.  The pericardium was opened longitudinally 3  cm anterior to the phrenic nerve.  Silk traction sutures were placed  both anteriorly and posteriorly.   The patient was heparinized systemically.  Pursestring sutures were  placed on the anterior surface of the right common femoral vein and the  right common femoral artery.  The right common femoral vein was  cannulated through the pursestring suture at the greater saphenous bulb  and a long flexible guidewire was advanced up through the inferior vena  cava through the right atrium into the superior vena cava using  transesophageal echocardiogram for guidance.  The femoral vein was  dilated with serial dilators and the 22-French long femoral venous  cannula was advanced over the guidewire through the right atrium until  the tip of the cannula extends into the superior vena cava, again using  transesophageal  echocardiogram for guidance.  The right femoral artery  was cannulated with Seldinger technique and a flexible guidewire was  advanced into the descending thoracic aorta.  The femoral artery was  dilated with serial dilators and a 16-French femoral arterial cannula  was advanced uneventfully.  A small stab incision was made low in the  right neck and the right internal jugular vein was cannulated with a  Seldinger technique.  The internal jugular vein was dilated with serial  dilators and a 14-French pediatric femoral venous cannula was advanced  over the guidewire into the superior vena cava.   Cardiopulmonary bypass was begun.  Vacuum-assisted venous drainage was  employed.  Exposure was felt to be excellent.  A Rultract retractor was  used to retract the sternum anteriorly to increase the AP diameter of  the chest and facilitate additional exposure.  A pursestring suture was  placed in the lateral surface of the right atrium and a retrograde   cardioplegic cannula was placed through the pursestring suture through  the right atrium into the coronary sinus using transesophageal  echocardiogram for guidance.  An antegrade cardioplegic cannula was  placed directly in the ascending aorta.  The location chosen for  subsequent aortotomy later during the procedure.   Aortic crossclamp was applied and cold blood cardioplegia was delivered  initially in an antegrade fashion through the aortic root.  Cardioplegia  was then administered retrograde through the coronary sinus catheter  after initial diastolic arrest.  Initial diastolic arrest was rapid and  without significant left ventricular chamber enlargement.  Repeat doses  of cardioplegia were administered intermittently throughout the entire  crossclamp portion of the operation every 20 minutes retrograde through  the coronary sinus catheter.  In addition, one single dose of antegrade  cardioplegia was administered using handheld cannulas later during the  procedure after mitral valve replacement has been completed.  Myocardial  protection was felt to be excellent and the patient maintains completely  flat electrocardiogram throughout the procedure.   The antegrade cardioplegic cannula was removed and transverse oblique  aortotomy incision was performed.  The aortic valve was exposed.  The  aortic valve has gross findings consistent with rheumatic disease.  There is thickening and prolapse of the noncoronary leaflet of the  valve.  The valve was otherwise tricuspid and normal in appearance.  The  coronary arteries were in their normal anatomical location.  The aortic  valve was excised sharply.  There was no annular calcification.  The  aortic valve was initially sized and annulus appears to fit a 21-mm  mechanical valve.   The left atriotomy incision was performed posteriorly through the  interatrial groove.  The floor of the left atrium was exposed using a  retractor blade  attached to the separate side arm and retracted  anteriorly.  Exposure was felt to be excellent.  The mitral valve was  carefully examined.  The mitral valve has gross findings consistent with  rheumatic disease.  Both the anterior and posterior leaflets were  severely thickened.  There was foreshortening and thickening of much of  the subvalvular apparatus.  Valve repair was not felt to be feasible.  Mitral valve replacement was performed with preservation of portions of  the subvalvular apparatus.  The majority of the anterior leaflet was  excised with exception of two paddles of leaflet and they were attached  chordae tendineae.  Each of the two paddles were retracted  anterolaterally to the mitral annulus and temporally tacked in place  with 4-0 Prolene sutures.  Posterior mitral leaflet was split in the  midline and much of the bulk of the leaflet was debrided to facilitate  valve replacement, although the majority of the subvalvular apparatus to  the posterior leaflet was preserved.  Valve was now sized to accept at  33-mm mechanical prosthesis.   Mitral valve replacement was performed using interrupted 2-0 Ethibond  horizontal mattress sutures placed circumferentially around the entire  mitral annulus with pledgets in the supraannular position.  The two  paddles of the anterior leaflet with associated chordae tendineae were  incorporated into the valve replacement suture line.  A Sorin  [employer] OptiForm mechanical mitral valve prosthesis (model number [id number]  [id number], size 33-mm, serial number [id number]) was secured in place  uneventfully.  After valve replacement was been completed, the valve was  carefully inspected and tested to make sure that the leaflets open and  close without impingement.  The left atriotomy was now closed using a  two-layer closure of running 3-0 Prolene suture after placement of a  Sump drain into the left atrium to serve as a vent.   The aortic root was  now reexamined and resized.  A 21-mm mechanical  prosthesis still appears to fit appropriately.  Aortic valve replacement  was performed using interrupted 2-0 Ethibond horizontal mattress  pledgeted sutures with pledgets in the subannular position.  The Sorin  [employer] mechanical prosthesis (model number S5-021, size 21  mm, serial number [id number]) was secured in place uneventfully.  The  leaflets open and close without impingement and the left main and right  coronary arteries appeared be free from the sewing margin.  Rewarming  was begun.   The aortotomy incision was closed using interrupted 4-0 pledgeted  Prolene horizontal mattress sutures followed by a running suture line.  The patient was placed in Trendelenburg position.  One final dose of  warm retrograde hot shot cardioplegia was administered.  The lungs were  ventilated and heart allowed to fill and any residual air evacuated  through the last suture of the aortotomy prior to completion of the  closure.  The aortotomy closure was then completed and the aortic  crossclamp was released after a total crossclamp time of 193 minutes.   The heart began to beat spontaneously without need for cardioversion.  The retrograde cardioplegic cannula was removed.  Epicardial pacing  wires were fixed to the right ventricular free wall into the right  atrial appendage.  The pericardial space was drained using a 28-French  Bard drain exited through the anterior port incision.  The patient was  rewarmed to 37 degrees centigrade temperature.  The left ventricular  vent was now removed.  The patient was weaned from cardiopulmonary  bypass without difficulty.  The patient's rhythm at separation from  bypass was normal sinus rhythm.  No inotropic support was required.  Total cardiopulmonary bypass time for the operation was 242 minutes.   Followup transesophageal echocardiogram was performed by Dr. Noreene Larsson  after separation from bypass  demonstrates preserved left ventricular  function.  There were mechanical aortic and mitral prostheses in place  that were functioning normally.  There was no sign of any perivalvular  leak.  No other abnormalities were noted.   Protamine was administered to reverse the anticoagulation.  Femoral  venous cannula was removed and pursestring suture was secured.  The femoral arterial cannula was removed and pursestring suture was secured.  There was a pulse palpable in the distal femoral artery.  The right  internal jugular cannula was removed and the cannulation site controlled  with an everting pledgeted stitch and manual pressure of 15 minutes.   Single lung ventilation was begun.  The aortotomy and left atriotomy  closure were inspected for hemostasis.  There appears to be significant  bleeding despite intact aortotomy and atriotomy closures.  The patient  was transfused two packs of platelets and 2 units of fresh frozen plasma  for coagulopathy and thrombocytopenia.  An exhaustive search for sites  of bleeding was performed.  There does not appear to be any bleeding  from the chest wall and bleeding appears to be emanating from the  pericardial space.  Initially, attempts to pack the pericardial space  and identify the source of bleeding were performed for an exhaustive  period of time.  However, despite these efforts, significant bleeding  persists ultimately mandating additional surgical exposure to identify  the source.   A median sternotomy incision was performed and the existing anterior  thoracotomy incision was extended to the midline to create a large T  incision.  Pericardium was opened anteriorly and pericardial stay  sutures were placed.  The site of bleeding was immediately evident and  due to have very small laceration in the tip of the left atrial  appendage.  It was unclear whether or not this laceration was due to  injury that occurred during mitral valve replacement  or injury related  to aortic crossclamp placement.  The injury was easily controlled with a  single pledgeted 4-0 Prolene suture.  No further bleeding of any  significant was noted other than generalized oozing from the sternal  edges.  The mediastinum and right pleural space were irrigated with warm  saline solution.   The median sternotomy was now drained with a 28-French chest tube exited  through the anterior stab incision.  The right pleural space was drained  using a 28-French Bard drain through the posterior port incision.  The  pericardium was reapproximated using a patch of CorMatrix bovine tissue  matrix patch.  The sternum was closed using double-strength sternal  wire.  The third costal cartilage was reattached to the sternum with a  Synthes plate.  The thoracotomy incision was otherwise closed in  multiple layers in routine fashion and the soft tissues anterior to the  sternum were closed in multiple layers.  All skin incisions were closed  with subcuticular skin closures.  The right groin incision was inspected  for hemostasis and subsequently closed in multiple layers.  Skin  incision was also closed with subcuticular skin closure.   The patient tolerated the procedure well.  The patient was reintubated  using a single-lumen endotracheal tube prior to transport to the  surgical intensive care unit.  All sponge, instrument, and needle counts  were verified and correct at completion of the operation.      Salvatore Decent. Cornelius Moras, M.D.  Electronically Signed     CHO/MEDQ  D:  03/26/2009  T:  03/26/2009  Job:  [id number]   cc:   Sheliah Mends, MD

## 2011-01-27 NOTE — Assessment & Plan Note (Signed)
OFFICE VISIT   [name], [name]  DOB:  [birth year]                                        February 25, 2009  CHART #:  [id number]   The patient returns for followup of severe aortic and mitral  regurgitation.  She was originally seen in consultation on Feb 04, 2009,  and a full history and physical exam was dictated at that time.  Since  then, the patient has been seen in followup by her cardiologist, Dr.  Garen Lah.  She has continued to have significant exertional shortness of  breath, and after further consultation, she now desires to make plans to  proceed with elective surgical intervention in the near future.  She  underwent left and right heart catheterization by Dr. Garen Lah on February 18, 2009.  She was found to have normal coronary artery anatomy with no  significant coronary artery disease.  Pulmonary artery pressures were  normal.  There was moderate left ventricular dysfunction with ejection  fraction 45% and severe mitral regurgitation.  The patient now returns  for further consultation.   I again discussed options at length with the patient in the office  today.  She now understands and accepts the fact that both her aortic  and mitral valves will likely need to be replaced, and because of her  age, we would recommend use of mechanical prosthesis.  This will mean  need for long-term anticoagulation with Coumadin, and this also  obviously implies that she no longer should consider pregnancy and  childbirth.  We discussed alternatives and in particular the use of  bioprosthetic tissue valves.  However, this would be associated with a  fairly high likelihood of need for repeat surgery down the road.  Although this would offer her the opportunity to consider having  additional children in the short term, a long-term would be associated  with more heart surgery and associated risk.  After discussing matters  with her husband, she now accepts the idea that  we would use mechanical  prosthesis to replace her valves at this time.   We also discussed surgical alternatives and in particular, we discussed  use of conventional median sternotomy versus miniature thoracotomy for  minimally invasive approach.  All of her questions have been addressed.  She is interested in minimally invasive approach to facilitate speedy  return to normal activity.  Because she is young and based upon her  valvular anatomy, I feel that this might be a good option for her under  the circumstances.  There is always a chance we would need to extend her  thoracotomy and/or convert to a full sternotomy depending upon operative  findings.  We will tentatively plan to proceed with surgery on Tuesday,  March 26, 2009.  The patient and her husband will return to see me for  final consultation and preoperative counseling on Monday, March 25, 2009.  All of her questions have been addressed.   Salvatore Decent. Cornelius Moras, M.D.  Electronically Signed   CHO/MEDQ  D:  02/25/2009  [name]:  02/26/2009  Job:  [id number]   cc:   Sheliah Mends, MD

## 2011-01-27 NOTE — Cardiovascular Report (Signed)
[name], [name] NO.:  [id number]   MEDICAL RECORD NO.:  [id number]          PATIENT TYPE:  OIB   LOCATION:  2899                         FACILITY:  [facility]   PHYSICIAN:  Sheliah Mends, MD      DATE OF BIRTH:  11/05/1980   DATE OF PROCEDURE:  02/18/2009  DATE OF DISCHARGE:  02/18/2009                            CARDIAC CATHETERIZATION   INDICATIONS:  [name] is a 30 year old lady with history of  severe mitral regurgitation, moderate-to-severe aortic regurgitation,  and mild left ventricular dysfunction based on an echocardiogram from  earlier this year.  She has met class I indications for mitral valve and  aortic valve replacement and cardiac catheterization is carried out  prior to surgery.   PROCEDURE:  After informed consent was obtained, the patient was brought  to the second floor cardiac catheterization lab at [facility]  in the postabsorptive state.  The patient was draped in a sterile  fashion and started on conscious sedation using Versed and fentanyl.  Zofran was given p.r.n. nausea throughout the procedure.  Local  anesthesia with 1% lidocaine was used to place a right femoral artery  and right femoral vein sheath using the modified Seldinger technique.  A  5-French arterial sheath and a 7-French venous sheath was placed without  complications.  Subsequently, right heart catheterization was carried  out using a Swan-Ganz catheter set.  Left heart catheterization was  completed using 5-French left and right #4 Judkins catheter.  A standard  pigtail catheter was used for left ventriculography.  There were some  technical difficulties with the table and pending is suboptimal on some  of the images for that reason.   FINDINGS:  Right heart catheterization:  RA 3 mmHg, RV 14/1 mmHg, PA  12/6 mmHg, PWCP 5 mmHg   HEMODYNAMICS:  Left ventricular pressure 106/4 mmHg.  Aortic pressure  120/56 mmHg.   SELECTIVE CORONARY ANGIOGRAPHY:   Left main coronary artery:  The left  main coronary artery is a short, large caliber vessel.  There is no  angiographically significant disease seen.   LAD:  The LAD is a medium caliber, long vessel that gives off two  diagonal branches.  The LAD has no angiographically significant disease.  The diagonal arteries are normal.   Left circumflex artery:  The left circumflex artery is a medium-sized  vessel that gives off two obtuse marginal branches.  The first obtuse  marginal branch is small and the second obtuse marginal is a large  vessel.  The distal AV groove left circumflex artery is a medium-sized  vessel.  There is no angiographically significant disease in the left  circumflex territory.   Right coronary artery:  The right coronary artery is a medium-size  vessel that gives off a small PDA.  The right coronary artery has no  angiographically significant disease.   LEFT VENTRICULOGRAPHY:  Left ventriculography shows moderate LV  moderate, global left ventricular dysfunction with an ejection fraction  of 45%.  There is severe, 4+ mitral regurgitation noted.   SUMMARY:  1. Normal coronary artery disease without  angiographically significant      disease.  2. Normal right heart pressures.  3. Moderate left ventricular dysfunction.  4. Severe mitral regurgitation.   PLAN:  I discussed these findings with Ms. Mccartney and her husband.  I  recommended to accelerate the timing of her valve replacement given her  progressing left ventricular dysfunction.   The patient will followup with Dr. Cornelius Moras at [facility] in 1 week in my  office for a postoperative check in 1 week   The procedure was completed without complications.      Sheliah Mends, MD  Electronically Signed     JE/MEDQ  D:  02/18/2009  T:  02/19/2009  Job:  [id number]   cc:   Salvatore Decent. Cornelius Moras, M.D.

## 2011-01-27 NOTE — Consult Note (Signed)
[name], [name]                ACCOUNT NO.:  [id number]   MEDICAL RECORD NO.:  [id number]          PATIENT TYPE:  INP   LOCATION:  9374                          FACILITY:  [facility]   PHYSICIAN:  Jackie Plum, M.D.DATE OF BIRTH:  March 07, 1981   DATE OF CONSULTATION:  10/22/2008  DATE OF DISCHARGE:                                 CONSULTATION   REASON FOR CONSULTATION:  Respiratory distress and hypoxia.   IMPRESSION:  1. Respiratory distress due to hypoxia related to acute pulmonary      edema/congestive cardiomyopathy, possibly pregnancy related      (peripartum), rule out infrequent viral etiology.  2. Cardiomegaly.  3. Leukocytosis.  4. Thrombocytopenia, mild.  5. Elevated cardiac markers, mild.  6. Hyponatremia, mild.   RECOMMENDATIONS:  1. Diuretics administered with improvement.  Therefore, the patient      will be placed on diuretics with monitoring of her electrolytes.      The fact that the effect of Lasix on lactation safety is unclear      due to lack of adequate literature availability was discussed with      Ms. Godden and she expressed understanding.  2. Hold antibiotics for now and monitor the patient's leukocytosis,      which is likely stress/imagination related as well as her      thrombocytopenia, which is mild and may be due to pregnancy related      hypervolemia.  3. Follow up on 2-D echocardiogram report.  4. Consider outpatient stress test on discharge.  5. A 12-lead EKG was ordered, report is pending for evaluation.  6. Followup on the patient's blood after effusion, which was likely      due to pulmonary edema/congestive cardiomyopathy.   [name] was seen, chart was reviewed.  Her medications were reviewed.  She is a very pleasant 30 year old [place] lady who was admitted to  [facility] yesterday for delivery of a second baby.  According to  the patient, she had mostly had an uneventful pregnancy set for what  sounds like upper  respiratory infection during her second pregnancy for  which she was treated appropriately by her obstetrician without any  complications.  She notes that over last month or so her blood pressure  has been had unusual for her and her systolic blood pressures have  been in the 140s range.  However, she admits to the fact that blood  pressure has not been a real problem during her obstetric visits.  She  was admitted that she had some peripheral edema over the last few weeks  that had been worsening.  By last 2 weeks, she admitted that she gets  more short of breath when she lies flat and has required to lie more in  the sitting position to be comfortable.  The patient states that she did  not think that was a problem and did not really consult her physicians  because she felt that it was due to the pregnancy and a size of a gravid  uterus which was enlarging.  However, in MICU  she was admitted  yesterday and seemed to be doing well except for the fact that at 0815  hours, her blood pressure was documented to be 165 with a heart rate of  123.  She seemed to have overall done well until sometime today when she  complained of shortness of breath with tachypneic and tachycardia with  heart rate of 114, respiratory rate of 26, BP 147/94, and temp of 98.0  degrees Fahrenheit.  She did not have any documented fevers.  The  patient was started on oxygenation.  An Internal Medicine consult was  obtained promptly.  She had x-ray of her chest done which revealed  cardiomegaly with bilateral airspace disease.  She also had a lab work  done and her white count was 13,700.  Of note, the patient's white count  was 7.2 yesterday, October 21, 2008, a hemoglobin was 12.4, hematocrit  37, and platelets 135 (her platelet count was 127,000 yesterday).  She  had a 86% neutrophils with 11.8 K/uL absolute neutrophil count (upper  limits of normal was 7.7 K/uL).  She had a sodium of 134, potassium of  4.0,  chloride 104, CO2 of 22, glucose 99, BUN is 17, and creatinine is  0.96.  Alkaline phosphatase was 146.  She had elevated AST and ALT of 42  and 40 respectively, was just mild and possibly due to mild hepatic  congestion due to congestive cardiomyopathy.  Her total protein was 5.5  and albumin 2.5.  Cardiac enzymes were ordered and reviewed, elevated  total CPK 109 (upper limit of normal was 177).  She had mild elevated CK-  MB fraction of 10 (upper limit of normal was 4.0).  Her relative index  was elevated at 5.0 (upper limits of normal was 2.5).  She had elevated  troponin, which is mild of 0.29 with elevated B-type natriuretic peptide  of 6110.  A 2-D echocardiogram was requested by me today, results are  still pending.   PAST MEDICAL HISTORY:  The patient denied any previous history of  hypertension, diabetes mellitus, rheumatic heart disease, or any form of  cardiopulmonary chronic illness.   SOCIAL HISTORY:  The patient does not smoke cigarette nor drink alcohol.   FAMILY HISTORY:  Positive for high blood pressure.   MEDICATION HISTORY:  The patient denies any medication allergies.   CURRENT MEDICATIONS:  Her current medications were reviewed and they  include:  1. Senokot 1-2 tabs at bedtime.  2. Tylenol 325 mg to 360 mg q.4 h. p.r.n.  3. Benzocaine spray (Dermoplast) as instructed.  4. Guaifenesin 50 mL p.o. q.4 h. p.r.n.  5. Motrin 600 mg p.o. q.6 h. p.r.n.  6. Cepacol Lozenges q.2 h. p.r.n.  7. Zofran 4 mg p.r.n.  8. Percocet 1-2 tabs q.3 h. p.r.n.  9. Darvocet 1 tab q.3 h. p.r.n.  10.Ambien 5-10 mg  p.o. at bedtime p.r.n.  11.In addition Lasix 20 mg IV q.12 h. was added today.   She had normal creatinine clearance of 99.16 mL/minute.   REVIEW OF SYSTEMS:  On 10-point systemic inquiry was as noted above,  otherwise review of systems was unremarkable.   PHYSICAL EXAMINATION:  VITAL SIGNS:  BP 104/75, pulse 116 per minute,  respiration was documented to be 36, and  temp 97.5 degrees Fahrenheit.  GENERAL:  The patient was in acute cardiopulmonary distress.  Overall  improved post diuresis.  HEENT:  Normocephalic and atraumatic.  Pupils were equal and reactive to  light.  Extraocular movements intact.  Oropharynx moist.  __________  were present.  NECK:  Supple.  No JVD.  LUNGS:  A diminished bibasilar breath sounds and occasional wheezes.  CARDIAC:  The patient was mild tachycardic and regular.  No gallop.  She  had a 2-3/6 systolic ejection murmur at left sternal border with some  radiation.  ABDOMEN:  Soft.  Bowel sounds present.  EXTREMITIES:  Pedal pitting edema about 1+ noted.  NEUROLOGIC:  The patient was alert and oriented x3 with no focal  deficit.   Labs as noted above.   PLAN:  We would add albuterol nebulizer.   Ms. Lechtenberg will be continued on above plan of care, would add scheduled  bronchodilator, nebulizers.  We will  use Xopenex instead of albuterol  in view of a low-grade tachycardia.  The effect of Xopenex on lactation  was equally discussed with the patient with adequate literature  available.  We will follow up with the patient's progress in the  hospital and I will be happy to see her in the office on discharge  regarding her medical issues.  We will continue plan of care as above  and we will follow along.  Further recommendations will be made as her  overall baseline is fine.  I have ordered for CBC and BNP in the  morning.  I have decided not to start any antibiotics since I doubt that  she has a respiratory infection at this time.  A 12-lead EKG was  completed and it was interpreted as sinus tachycardia at 107 beats per  minute with left atrial enlargement without any acute ST-T wave changes.   Thank you for this consultation.      Jackie Plum, M.D.  Electronically Signed     GO/MEDQ  D:  10/22/2008  T:  10/23/2008  Job:  [id number]   cc:   Arlyce Harman

## 2011-01-27 NOTE — H&P (Signed)
HISTORY AND PHYSICAL EXAMINATION   Feb 04, 2009   Re:  [name], [name]          DOB:  03-12-1981   REQUESTING PHYSICIAN:  Sheliah Mends, MD   REASON FOR CONSULTATION:  Severe aortic regurgitation and mitral  regurgitation.   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  The patient was first noted to have a heart  murmur on physical exam during her first pregnancy, a little more than 2  years ago.  She completed her pregnancy without complication and no  followup was sought out.  Four months ago, she gave birth to her second  child.  She developed congestive heart failure during her last week  prior to delivery.  After delivery, she suffered from severe shortness  of breath and orthopnea.  X-ray revealed pulmonary edema, an  echocardiogram was performed demonstrating mild mitral stenosis with  severe mitral regurgitation and moderate-to-severe aortic regurgitation.  The patient was seen in consultation by Dr. Garen Lah, and a  transesophageal echocardiogram confirmed similar findings with notably  dilated left ventricle in addition to severe mitral regurgitation and  moderate-to-severe aortic regurgitation.  The patient has subsequently  referred for possible elective surgical intervention.   REVIEW OF SYSTEMS:  GENERAL: The patient has normal appetite.  CARDIAC:  The patient reports that symptoms of shortness of breath and orthopnea  have improved over the last 4 months and currently remained stable.  She  now has only mild exertional shortness of breath, and she otherwise  remains asymptomatic.  She denies productive cough, hemoptysis,  wheezing.  GASTROINTESTINAL:  Negative.  The patient has no difficulty  swallowing.  She has normal bowel function.  GENITOURINARY:  Negative.  PERIPHERAL VASCULAR:  Negative.  NEUROLOGIC:  Negative.  HEENT:  Negative.  HEMATOLOGIC:  Negative.   PAST MEDICAL HISTORY:  Otherwise, unremarkable.   PAST SURGICAL HISTORY:  None.   FAMILY HISTORY:  Noncontributory.   SOCIAL HISTORY:  The patient is legally single, but lives with her  significant other who she introduces as her husband, locally here in  [place].  She works full time as a Interior and spatial designer.  She is a nonsmoker.  She denies alcohol consumption.  She has 2 children at home including a  64-year-old and her recently born 41-year-old, both of whom are healthy.   CURRENT MEDICATIONS:  1. Aspirin 81 mg daily.  2. Lasix 20 mg daily.  3. Lisinopril 5 mg daily.  4. Pravastatin 20 mg every other day.  5. Coreg 3.25 mg daily.  6. Allegra as needed for allergies.   DRUG ALLERGIES:  None known.   PHYSICAL EXAMINATION:  GENERAL:  The patient is a well-appearing  oriented female who appears of his stated age in no acute distress.  VITAL SIGNS:  Blood pressure 116/71, pulse 76 regular, oxygen saturation  99% on room air.  HEENT:  Unrevealing.  NECK:  Supple.  There is no jugular venous distention.  CHEST:  Auscultation of the chest demonstrates clear breath sounds,  which are symmetrical bilaterally.  CARDIOVASCULAR:  Regular rate and rhythm.  There is a grade 3-4/6  holosystolic murmur heard best at the apex with radiation to the axilla  and back.  There is also a grade 3/6 early diastolic murmur heard along  the left lower sternal border.  ABDOMEN:  Soft, nondistended, nontender.  Bowel sounds are present.  EXTREMITIES:  Warm and well perfused.  There is no lower extremity  edema.  Distal pulses are palpable.  RECTAL:  Deferred.  GU:  Deferred.  NEUROLOGIC:  Grossly nonfocal and symmetrical throughout.   DIAGNOSTIC TESTS:  Transesophageal echocardiogram performed by Dr.  Garen Lah in January 07, 2009 is reviewed.  This demonstrates findings  consistent with underlying rheumatic aortic and mitral valve disease  with severe mitral  regurgitation and moderate-to-severe aortic  regurgitation.  The leaflets of the mitral valve are somewhat thickened,  and there is restricted leaflet motion during both systole and diastole.  The posterior leaflet is virtually fixed.  There is some thickening as  well as acoustic shadowing of the subvalvular apparatus.  There is a  broad central jet of mitral regurgitation that is quite severe and feels  the left atrium.  The jet of mitral regurgitation is somewhat eccentric  directed posteriorly.  The aortic valve is tricuspid.  There is an  eccentric jet of the aortic regurgitation directed along the  interventricular septum.  There is moderate tricuspid regurgitation.  The left ventricle is moderately dilated.  The left atrium is severely  dilated.  No other abnormalities are noted.   IMPRESSION:  Rheumatic aortic and mitral valve disease with moderate to  severe aortic regurgitation and severe mitral regurgitation.  The  patient has dilated left ventricle and recent symptoms of class IV  congestive heart failure at the time of delivery from her second  pregnancy.  The patient has improved on medical therapy but still has  mild exertional shortness of breath.  I agree that it would make sense  to proceed with elective surgical intervention for aortic and mitral  valve replacement.  I am skeptical that the mitral valve could be  repaired successfully based upon findings of her recent transesophageal  echocardiogram, and in the setting of double valve disease, it would  make most sense to proceed with double valve replacement using  mechanical prosthesis based upon this patient's age.  However, this  would come with a need for long-term anticoagulation with Coumadin and  its obvious ramifications in terms of potential for further  childbearing.   PLAN:  I have discussed the matters at length with the patient and her  husband here in the office today.  We discussed alternatives at the  time  of surgery including use of mechanical prosthesis versus use of some  type of bioprosthetic tissue valve and/or other alternatives for  management of either aortic or mitral valve.  Because of the patient's  age, I would strongly encouraged her to consider use of mechanical  prosthesis and accept the need for long-term anticoagulation with  Coumadin.  It seemed to be receptive to this concept, although they had  not thought about the fact that she would not be able to bear children  in the future.  Because of the need for both aortic and mitral valve  replacement, the relative small size of her aortic root, and the degree  of severe aortic regurgitation, I do not favor the use of a miniature  thoracotomy for minimally invasive approach and instead would favor use  of a full median sternotomy as a more conservative and safe approach  under the circumstances.  Finally, she will need to have her coronary  anatomy image prior to surgery.  On these circumstances, I would favor  use of cardiac catheterization for coronary arteriography as well as  right heart catheterization to assess her pulmonary artery pressures  directly.  We could consider CT angiogram to image the proximal coronary  artery as an alternative.  All of their questions have been addressed.  The patient and her husband decided to think matters over for period of  time and hope to wait at least a couple of months to allow their  youngest child to develop a bit more before the patient has surgery.  This seems reasonable, but I have cautioned them, they will need to be  followed carefully due to this fact that her left ventricle is already  begun to dilate and in the long run, she may develop further worsening  of the left ventricular function.  We will make arrangements for them to  be seen in followup by Dr.  Garen Lah to consider left and right heart catheterization in the near  future.  We will plan to see her back in 2  months' time.   Salvatore Decent. Cornelius Moras, M.D.  Electronically Signed   CHO/MEDQ  D:  02/04/2009  [name]:  02/05/2009  Job:  [id number]   cc:   Sheliah Mends, MD

## 2011-01-27 NOTE — Discharge Summary (Signed)
[name], [name]                ACCOUNT NO.:  [id number]   MEDICAL RECORD NO.:  [id number]          PATIENT TYPE:  INP   LOCATION:  2923                         FACILITY:  [facility]   PHYSICIAN:  Sheliah Mends, MD      DATE OF BIRTH:  08/19/1981   DATE OF ADMISSION:  04/03/2009  DATE OF DISCHARGE:  04/05/2009                               DISCHARGE SUMMARY   DISCHARGE DIAGNOSES:  1. Atrial fibrillation with rapid ventricular response, now      maintaining sinus rhythm.  2. Fever of 101 with negative cultures currently.  3. Status post mitral and aortic valve replacement with mechanical      valves, March 26, 2009.  4. Volume overload, improved.   DISCHARGE CONDITION:  Improved.   PROCEDURES:  None.   DISCHARGE MEDICATIONS:  1. Amiodarone 200 mg tablet 2 tablets twice a day to equal 400 mg      twice a day until Monday, April 08, 2009 and then 1 tablet twice a      day to equal 200 mg twice a day.  2. Toprol-XL 25 mg daily.  3. Lisinopril 10 mg daily.  4. Furosemide 40 mg 1 daily for 7 more days.  5. Potassium chloride 20 mEq 1 daily for 7 more days.  6. Aspirin 325 mg daily.  7. Coumadin, she received 10 mg on April 05, 2009.  She will take 1-1/2      tablets to equal 7.5 mg on Saturday and 5 mg on Sunday, July 25,      20 10.  On Monday, the Coumadin Clinic will direct further dosages.   DISCHARGE INSTRUCTIONS:  1. No work as before.  2. Low-sodium heart-healthy diet.  3. For wound care and activity follow guidelines from previous      discharge.  4. Follow up with Dr. Garen Lah on April 16, 2009 at 11:15 a.m.  5. Follow up with Dr. Cornelius Moras as before.  6. Follow up with Coumadin Clinic at [facility] on April 08, 2009 at 2:30 p.m.   HISTORY OF PRESENT ILLNESS:  Please see dictated H and P.  The patient  was admitted on April 03, 2009 after one episode of fever of 100.4 and  then she felt her heart racing.  She was found to be in atrial fib in  the  emergency room with rapid ventricular response.  Initially given a  bolus of Cardizem and drip of Cardizem without improvement in her heart  rate.  We then bolused her with amiodarone 150 mg and a drip at 1 mg per  minute with still no control of her AFib.  Second bolus of IV amiodarone  and the patient slowed and then eventually converted to sinus rhythm.  She was admitted to the Step-Down Unit for close monitoring with her  recent history of the mitral and aortic valve replacements with  mechanical valves.  The patient did quite well.  She was monitored.  Blood cultures were done secondary to elevated fever at home and no  growth on urine culture  or blood cultures and for now they will be held  for a total of 5 days.   DISCHARGE PHYSICAL EXAMINATION:  VITAL SIGNS:  Heart rate 79, blood  pressure at times is low after we increased the Toprol to 50, so we will  decrease it back to 25 now that she is on her amiodarone, and blood  pressure at discharge was 100/67, pulse 87, respirations 16, temperature  98.2, peak temperature was 99 today.  HEART:  Regular rate and rhythm.  LUNGS:  Clear to auscultation.  ABDOMEN:  Soft and nontender.  EXTREMITIES:  With no edema.   FOLLOWUP:  Dr. Garen Lah will see her next week.  She will have a  Coumadin check on Monday.   LABORATORY DATA:  Hemoglobin at discharge 12.1, hematocrit 35.7, WBC  7.8, platelets 289, and MCV 86.9.  Chemistry:  Sodium 136, potassium 4,  chloride 102, CO2 27, BUN 9, creatinine 0.70, and glucose 113.  Coags on  day of discharge, her Protime is 18.7, INR of 1.5 despite on amiodarone.  Cardiac markers were done.  CK-MB were negative 54 and 1.9.  Troponin  was slightly elevated at 1.82 secondary to the atrial fib.  UA was  negative.  Magnesium 2.2 and calcium 9.1.  BNP was 541 on admission.   RADIOLOGY:  Chest x-ray on admission:  Small right effusion and  bibasilar atelectasis, stable.  Two view of her chest on April 04, 2009:  Moderate-sized pleural effusion on the right which is probably partially  loculated laterally.  There was abnormal density in the right lower  lobe, could be pneumonia or passive atelectasis related to the effusion.  There was no congestive heart failure.   EKG:  Initially A-flutter with rapid ventricular response.  Followup  EKGs, sinus rhythm.  She did develop flipped T-waves in V3-V6 and lead  II.   The patient will be followed up by Dr. Garen Lah and Dr. Cornelius Moras.      [name]. Annie Paras, N.P.      Sheliah Mends, MD  Electronically Signed    LRI/MEDQ  D:  04/05/2009  T:  04/06/2009  Job:  [id number]   cc:   Salvatore Decent. Cornelius Moras, M.D.  Jackie Plum, M.D.

## 2011-01-27 NOTE — Discharge Summary (Signed)
[name], [name]                ACCOUNT NO.:  [id number]   MEDICAL RECORD NO.:  [id number]          PATIENT TYPE:  INP   LOCATION:  2022                         FACILITY:  [facility]   PHYSICIAN:  Salvatore Decent. Cornelius Moras, M.D. 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  The  patient was first noted to have a heart murmur on physical exam during  her first pregnancy, a little more than 2 years ago.  She completed her  pregnancy without complication and no followup was sought.  Approximately 4 months ago, she gave birth to her second child.  She  developed congestive heart failure in her last week prior to delivery.  After delivery, she suffered from severe shortness of breath and  orthopnea.  Chest x-ray revealed pulmonary edema, and echocardiogram was  performed demonstrating mild mitral stenosis with severe mitral  regurgitation and moderate to severe aortic regurgitation.  She was seen  and evaluated by Dr. Sheliah Mends in cardiology consultation.  The  patient was confirmed to have these findings and was subsequently  referred to Dr. Tressie Stalker for surgical evaluation.  Dr. Cornelius Moras  evaluated the patient and studies and had multiple conversations in  regard to all the options as related to the surgery and ultimately, it  was determined that she was a candidate for proceeding with aortic and  mitral valve replacement, and she was admitted this hospitalization for  the procedure.   PAST MEDICAL HISTORY:  Otherwise unremarkable.   PAST SURGICAL HISTORY:  None.   FAMILY HISTORY:  Noncontributory.   SOCIAL HISTORY:  The patient works as a Interior and spatial designer.  She is a  nonsmoker.  She denies alcohol use.  She has 2 children, age 24 and age 54  months.   MEDICATIONS PRIOR TO ADMISSION:  1. Lasix 20 mg daily.  2. Lisinopril 5 mg daily.  3. Aspirin 81 mg daily.   ALLERGIES:  No known drug allergies.   For review of symptoms and physical exam, please see the dictated  history and physical done at the time of admission.   HOSPITAL COURSE:  The patient was admitted electively, and on March 26, 2009, she was taken to the operating room where she underwent the  following procedure:  Right miniature anterior thoracotomy with  conversion to median sternotomy for aortic valve replacement and mitral  valve replacement using a 21-mm Sorin [employer] Top Hat aortic valve  and a 33-mm Sorin [employer] Optiform mitral mechanical prosthesis.  The procedure was performed by Dr. Tressie Stalker.  She tolerated it  well.  Please see the operative report for full details.   POSTOPERATIVE HOSPITAL COURSE:  The patient's initial postoperative  hospital course was remarkable for both hypothermia and coagulopathy.  She was treated aggressively medically with product replacement, but  ultimately, it was determined that she needed to return to the operating  room  for re-exploration, which was done on the early morning of March 27, 2009.  Findings were noted for no active significant bleeding other than  oozing from the sternotomy.  She returned to the surgical intensive care  unit in a stable condition.  Postoperative hospital course following re-  exploration has been mostly unremarkable.  She did require some ongoing  aggressive transfusion to include blood products, FFP, and platelets, as  well as packed cells.  She stabilized in this regard in the intensive  care unit.  Additionally postoperatively, she had significant edema but  was started on a course of aggressive diuresis, which she has tolerated  well with good overall clinical improvement.  She did have a  postoperative thrombocytopenia that was felt to be consumptive but this  did also stabilize.   Her most recent platelet count, dated March 31, 2009, is 110,000 with hemoglobin and hematocrit on same date of 12.0 and  35 respectively.  She has been started on a course of anticoagulation  therapy slowly.  Her INR has been monitored daily with dosage adjusted.  Home dosage will be determined at the time of discharge.  Electrolytes,  BUN, and creatinine are within normal range.  Her incisions are healing  well without evidence of infection.  She has had some postoperative  hypertension and has been restarted on lisinopril, may require further  titration, and this will be assessed at the time of discharge.  She is  responding well in regards to routine cardiac rehabilitation modalities.  Oxygen has been weaned, and she maintains good saturations on room air.  Her overall status is felt to be tentatively stable for discharge in the  morning of April 02, 2009, pending morning round reevaluation.   MEDICATIONS ON DISCHARGE:  At the time of this dictation are as follows:  1. Aspirin 325 mg daily.  2. Coumadin dosage is to be determined at the time of discharge.  3. Lisinopril 10 mg daily.  4. Toprol-XL 25 mg daily.  5. Lasix 40 mg daily for 10 days.  6. Potassium chloride 20 mEq daily for 10 days.  7. For pain, oxycodone 5 mg every 4-6 hours as needed.   INSTRUCTIONS:  The patient will receive written instructions in regard  to medications, diet, activity, and wound care.   FOLLOWUP:  Dr. Cornelius Moras in 2 weeks and appointment will be called to the  patient.  Additionally, she will have cardiology followup with Dr.  Garen Lah to include Coumadin management.   FINAL DIAGNOSIS:  Severe aortic regurgitation and severe mitral  regurgitation, now status post aortic and mitral valve replacement.   OTHER DIAGNOSES:  1. Postoperative coagulopathy requiring product replacement      transfusion and re-exploration for bleeding.  2. Postoperative acute blood loss anemia.  3. Postoperative  hypertension.  4. Postoperative anticoagulation therapy with Coumadin.  5. Postoperative volume overload.  6. Postoperative thrombocytopenia.      Rowe Clack, P.A.-C.      Salvatore Decent. Cornelius Moras, M.D.  Electronically Signed    WEG/MEDQ  D:  04/01/2009  T:  04/02/2009  Job:  [id number]   cc:   Sheliah Mends, MD  Salvatore Decent. Cornelius Moras, M.D.

## 2011-01-27 NOTE — Op Note (Signed)
NAMEKENYATTE, GRUBER                ACCOUNT NO.:  [id number]   MEDICAL RECORD NO.:  [id number]          PATIENT TYPE:  AMB   LOCATION:  SDC                           FACILITY:  [facility]   PHYSICIAN:  Pieter Partridge, MD   DATE OF BIRTH:  [birth year]   DATE OF PROCEDURE:  DATE OF DISCHARGE:                               OPERATIVE REPORT   PREOPERATIVE DIAGNOSIS:  History of postpartum hemorrhage with  questionable retained products.   POSTOPERATIVE DIAGNOSES:  1. History of postpartum hemorrhage with questionable retained      products.  2. Left vulvar lesion.   PROCEDURE:  Suction dilatation and curettage under ultrasound guidance  and left vulvar biopsy.   SURGEON:  Shela Nevin. Dion Body, M.D.   ASSISTANT:  Surgical technician and ultrasound technician.   ANESTHESIA:  MAC, with a paracervical block of 2% lidocaine.   FINDINGS:  Thickened endometrial stripe with echogenic material in the  mid to lower uterine segment.  There was also a hyperpigmented vulvar  lesion on the left labia majora at 4 o'clock.   TISSUE:  Suction uterine contents were reviewed, and the tissue had the  appearance of placenta.   SPECIMENS:  Retained products of conception and left vulvar biopsy  specimens to pathology.   ESTIMATED BLOOD LOSS:  300 mL.   IV FLUIDS:  100 mL preop, intraop 400 mL including antibiotics.   COMPLICATIONS:  None.   Patient stable to PACU.   URINE OUTPUT:  Approximately 25 mL prior to procedure.   INDICATIONS:  Ms. Mirabella is a 30 year old G2, P1-0-0-1 who delivered a  healthy baby girl vaginally without complications on October 21, 2008.  Her postpartum course was complicated by pulmonary edema which was due  to cardiac dysfunction and HELLP syndrome.  Per cardiac echo, it  appeared that she had rheumatic fever and was unknown, that in addition  to the third fluid retention I believe contributed to her pulmonary  edema.  However, on postpartum day #6, the patient reported  having heavy  vaginal bleeding, passing grape/golf ball size clots and continuous  bleeding.  An ultrasound was done, and it showed that there was a clot  or tissue in the lower uterine segment or retained products, less likely  a clot.  There was some tissue that was removed and that pathology  returned as an endometrial polyp, but there were no chorionic villi and  no placenta noted.   The patient received Cytotec 800.  Her bleeding continued to flow, slow  down, however, on follow-up ultrasound there still seemed to be some  echogenic tissue noted on ultrasound.  She was counseled on definitive  management with suction D and C to prevent any further bleeding.   Preoperatively, anesthesia was informed of the patient's cardiac issues  and the cardiac echo was reviewed and restricted fluids were given.   PROCEDURE IN DETAIL:  Ms. Hepner was taken to the operating room and  placed in the dorsal supine position.  She underwent MAC anesthesia  without complication.  She was then placed in the lithotomy position  with Freida Busman  stirrups and prepped and draped in a normal sterile fashion.  The nurse attempted to catheterize her bladder, but it felt stenotic  with the routine red Robinson catheter.  A smaller catheter was used to  drain the bladder and there was some stenosis, but about 25 mL was  removed.  There was some bleeding at the site of the urethra that was  hemostatic at the end of the case.   A weighted speculum was placed in the vagina.  A single-tooth tenaculum  was applied to the anterior lip of the cervix and a paracervical block  was performed with 2% lidocaine, approximately 8 mL were used.  The  cervix was dilated and easily accommodated a 25 Hegar or Pratt dilator,  and the uterus was sounded to 11 cm.   The sharp curette was performed with ultrasound guidance to attempt to  remove the echogenic tissue which appeared to be yellowish-tannish  placental-appearing tissue.   Prior to draining the bladder, an  ultrasound was done to see where the location of the tissue was  preoperatively.   The 12 curette was advanced to the uterine fundus and a suction  curettage of the uterus was performed twice, and there were some  products of conception noted.  A sharp curettage was performed again and  there was again some tissue noted and suction curettage and sharp  curettage was performed until all tissue was removed.   Via the ultrasound, there still seemed to be something on the inside,  but when I would go back in, it was just clot, and the patient would  have significant bleeding when the uterus was scraped at that time.  So,  I ended the procedure, feeling confident that all of the retained  products had been removed.  Uterine massage and pressure with a sponge  stick was performed to control bleeding.  Cytotec 800 mcg was placed in  the rectum at the end of the case.  The single-tooth tenaculum was  removed and the tenaculum site was made hemostatic with silver nitrate.   A weighted speculum was placed in the vagina.  Episiotomy repair was  well healed.  However, that fell out and a bivalve Graves speculum was  used during the procedure.   The left vulvar lesion was noted and was biopsied with a punch biopsy of  2-3 mm, and silver nitrate was applied.   The patient tolerated the procedure well.  She was taken to the recovery  room in stable condition.      Pieter Partridge, MD  Electronically Signed     EBV/MEDQ  D:  11/01/2008  T:  11/01/2008  Job:  [id number]

## 2011-01-27 NOTE — Op Note (Signed)
NAMEYANETH, FAIRBAIRN                ACCOUNT NO.:  [id number]   MEDICAL RECORD NO.:  [id number]          PATIENT TYPE:  INP   LOCATION:  2314                         FACILITY:  [facility]   PHYSICIAN:  Salvatore Decent. Cornelius Moras, M.D. DATE OF BIRTH:  12-Mar-1981   DATE OF PROCEDURE:  03/27/2009  DATE OF DISCHARGE:                               OPERATIVE REPORT   PREOPERATIVE DIAGNOSES:  Bleeding status post aortic and mitral valve  replacement.   POSTOPERATIVE DIAGNOSIS:  Bleeding status post aortic and mitral valve  replacement.   PROCEDURE:  Mediastinal re-exploration for bleeding.   SURGEON:  Salvatore Decent. Cornelius Moras, MD   ASSISTANT:  Ms. Gus Puma, CRNFA.   ANESTHESIA:  General.   BRIEF CLINICAL NOTE:  The patient is a 30 year old female who underwent  aortic and mitral valve replacement on March 26, 2009.  This was  initially approached through a miniature right anterior thoracotomy, but  the procedure was converted to a full open sternotomy due to  postoperative bleeding requiring repair of a small site of bleeding  along the tip of the left atrial appendage.  The patient's initial  surgical procedure was also notable for the development of severe  coagulopathy and hypothermia by the end of the operation.  She was  brought back to the surgical intensive care unit where she was  resuscitated with transfusion of blood products and her body temperature  was warmed.  Initially chest tube output gradually diminished over time,  but during the early morning hours of March 27, 2009, some increase in  chest tube output was noted.  It was felt that the patient should be  brought to the operating room for reexploration to evacuate any residual  hematoma, rule out any continued ongoing surgical bleeding.  The  patient's husband was counseled at the patient's bedside and provides  full informed consent for the procedure as described.   OPERATIVE NOTE IN DETAIL:  The patient was brought to the operating  room  on the early morning hours of March 27, 2009.  Adequate general  anesthesia was verified and monitored under the care and direction of  Dr. Arta Bruce.  The patient was placed in the supine position on the  operating table.  The patient's anterior chest was prepared and draped  in sterile manner.  Previous median sternotomy incision was reopened.  The sternal wires were removed.  There was a moderate amount of clot and  blood within the anterior mediastinum beneath the sternum and within the  pericardial space.  This was all evacuated.  There were a few minor  sites of bleeding from along the sternal edge from the anterior  mediastinum and the thymus.  These were controlled with electrocautery  and suture ligation as necessary.  The previous aortotomy and left  atriotomy incisions were carefully inspected and notably completely  hemostatic.  The previous site of repair of the tip of the left atrial  appendage also remained completely hemostatic.  No other sites of  significant ongoing bleeding were identified.  The mediastinum and the  right pleural space were irrigated with  warm saline solution and old  clot and blood was evacuated.  The previously placed core matrix patch  was used to re-close the pericardium anteriorly and the sternal incision  was reclosed using double-strength sternal wires.  The previously placed  Synthes plate was reattached to the sternum in the midline.  The soft  tissues anterior to the sternum were closed in multiple layers and the  skin was closed with subcuticular skin closure.  The patient tolerated  the procedure well and was transported back to the surgical intensive  care unit in stable  condition.  There were no intraoperative complications.  All sponge,  instrument, and needle counts were verified correct at completion of the  operation.  The patient was transfused a total of 4 units of packed red  blood cells for acute blood loss  anemia.      Salvatore Decent. Cornelius Moras, M.D.  Electronically Signed     CHO/MEDQ  D:  03/27/2009  T:  03/27/2009  Job:  [id number]

## 2011-01-27 NOTE — Assessment & Plan Note (Signed)
OFFICE VISIT   [name], [name]  DOB:  1980-09-22                                        June 24, 2009  CHART #:  [id number]   HISTORY OF PRESENT ILLNESS:  The patient returns to the office today for  routine followup status post aortic and mitral valve replacement on March 26, 2009.  She was last seen here in the office on April 15, 2009.  Since then, she has done quite well.  She states that she now has very  little residual soreness in her chest and this really only bothers her  if she lays on the floor and goes to get up or move around suddenly.  She is able the left her child without any pain or soreness, and overall  she feels good.  She has no shortness of breath.  Her activity level is  good.  She has no complaints.  She does admit that she has had some  intermittent problems keeping her Coumadin level therapeutic, and she  thinks this is probably related to dietary habits.  She has no other  problems or complaints.  The remainder of her review of systems are  unremarkable.   PHYSICAL EXAMINATION:  General:  Well-appearing female.  Vital Signs:  Blood pressure 100/68, pulse 82 and regular, oxygen saturation 99% on  room air.  Chest:  A median sternotomy scar that is healing nicely.  The  sternum is stable on palpation.  Breath sounds are clear to auscultation  and symmetrical bilaterally.  No wheezes or rhonchi are noted.  Cardiovascular:  Regular rate and rhythm.  No murmurs, rubs, or gallops  are appreciated.  Prosthetic valve heart sounds are easily noted.  Abdomen:  Soft, nontender.  Extremities:  Warm and well perfused.  There  is no lower extremity edema.  The remainder of her physical exam is  unrevealing.   IMPRESSION:  Satisfactory progress, now almost 3 months status post  aortic and mitral valve replacement with mechanical prosthesis.  Overall, the patient is doing well and her sternotomy and mini  thoracotomy incisions are both  healing nicely.   PLAN:  I have encouraged the patient to continue to gradually increase  her physical activity as tolerated.  It will probably be a few months  before the soreness and numb feeling in her chest continues to abate,  but at this point, she can start increasing what she does without any  specific  limitations.  All of her questions have been addressed.  In the future,  she will call and return to see [place] only should further problems or  difficulties arise.   Salvatore Decent. Cornelius Moras, M.D.  Electronically Signed   CHO/MEDQ  D:  06/24/2009  T:  06/25/2009  Job:  [id number]   cc:   Sheliah Mends, MD

## 2011-01-27 NOTE — H&P (Signed)
**[name]** [name], [name] NO.:  [id number]   MEDICAL RECORD NO.:  [id number]          PATIENT TYPE:  OIB   LOCATION:  2899                         FACILITY:  [facility]   PHYSICIAN:  Sheliah Mends, MD      DATE OF BIRTH:  [birth year]   DATE OF ADMISSION:  02/18/2009  DATE OF DISCHARGE:                              HISTORY & PHYSICAL   This is a 30 year old female patient who was referred to Dr. Garen Lah by  Dr. Julio Sicks, secondary to pulmonary edema and cardiomegaly on x-ray  and elevated BNP.  The EF at that time was 65% by echo but there was  evidence for a mitral stenosis with severe MR, mild mitral prolapse and  moderate to severe aortic regurgitation, possibly secondary to rheumatic  etiology.  She had been asymptomatic prior to her pregnancy.  Her first  baby she had no complications with delivery and she has never had a  cardiac issues prior to this pregnancy.  She was seen and evaluated.  Repeat 2-D echo showed severely dilated left ventricle with abnormal EF  of 50%, severely dilated left atrium, severe mitral regurgitation with  posteriorly directed jet,  elevated right ventricular pressures and  moderate to severe aortic regurgitation.  Due to these complications and  her episodic shortness of breath and chest discomfort, she was sent to  Dr. Cornelius Moras for valve replacement evaluation.  Dr. Cornelius Moras agreed that she  would need valve replacement and discussion of cardiac CT but since the  patient does need a right heart catheterization as well, plans today are  combined left heart cath and right heart catheterization prior to  elective aortic and mitral valve replacement.  Dr. Cornelius Moras had talked at  length with the patient and her husband concerning mechanical versus  bioprosthetic tissue valve.  The patient is here today to undergo the  right and left heart cath.   PAST MEDICAL HISTORY:  As stated.  Other history is positive for  dyslipidemia, history of 2 successful  pregnancies.   OUTPATIENT MEDICATIONS:  1. Aspirin 81 mg daily.  2. Lasix 20 mg daily.  3. Potassium 20 mEq daily.  4. Pravastatin 20 mg that she takes more on a p.r.n. basis.   ALLERGIES:  No known drug allergies.   SOCIAL HISTORY:  The patient is married, 2 children.  No history of  tobacco or alcohol abuse.  She does not exercise.   FAMILY HISTORY:  No history of premature coronary disease, sudden  cardiac death or aortic dissection.   REVIEW OF SYSTEMS:  GENERAL:  No recent colds, fevers.  NEURO:  No  syncopal episodes.  PULMONARY: She is short of breath especially with  exertion.  If she gets very stressed, she is short of breath.  Along  with this, she does have chest discomfort.  She continues to do what she  is doing, it is very difficult to rest in her household at this time.  GI: No diarrhea, constipation or melena.  GU: No hematuria or dysuria.  We are doing a pregnancy test to make sure she is not  pregnant today.  ENDOCRINE:  No diabetes or thyroid disease.  MUSCULOSKELETAL:  Negative  for arthritic pains or back pain.  CARDIAC:  As previously described.   PHYSICAL EXAMINATION:  Blood pressure 100/57, oxygen saturation in room  air 100%, temperature 96.8, pulse 60, respirations 18.  GENERAL:  Alert, oriented female in no acute distress.  Pleasant affect,  some anxiety.  HEENT: Normocephalic.  Sclerae clear.  LUNGS:  Clear without rales, rhonchi or wheezes.  HEART: Sounds regular rate and rhythm with a 3-4/6 holosystolic murmur  heard best at the apex with radiation to the axilla and back.  There is  also a grade 3/6 early diastolic murmur heard along the left lower  sternal border.  ABDOMEN: Soft, nontender, positive bowel sounds.  Do not palpate liver,  spleen or masses.  LOWER EXTREMITIES: Without edema.  Pedal pulses are present.  NEURO:  Alert, oriented.  Follows commands.  Moves all extremities.  NECK:  Supple.  No JVD.  I do hear bruit radiating from valves  up into  her left carotid.   LABORATORY VALUES:  Hemoglobin 12.3, hematocrit 36, WBC 3.6, platelets  170.  Sodium 135, potassium 4.3, BUN 17, creatinine 0.8 and glucose 89.  TSH 2.170.   IMPRESSION:  Severe aortic and mitral valve regurgitation with plans for  a valve replacement in the near future.  She is symptomatic with  shortness of breath with stress and exertion and some mild chest  discomfort.   Plan today is to proceed with right and left heart catheterization for  evaluation and will follow up with Dr. Cornelius Moras.   The patient relates she prefers Dr. Garen Lah and Dr. Cornelius Moras to discuss  timing of valve replacement.      [name]. [name], N.P.      Sheliah Mends, MD  Electronically Signed    LRI/MEDQ  D:  02/18/2009  T:  02/18/2009  Job:  [id number]   cc:   Jackie Plum, M.D.

## 2011-01-27 NOTE — Assessment & Plan Note (Signed)
OFFICE VISIT   Katelyn, [name]  DOB:  February 19, 1981                                        March 25, 2009  CHART #:  [id number]   [name] returns to the office for further follow-up with tentative  plans to proceed with surgery tomorrow for treatment of rheumatic aortic  insufficiency and mitral regurgitation.  She was last seen here in the  office on February 25, 2009, and a full consultation and history and  physical exam were dictated Feb 04, 2009 at the time of her initial  visit.  She returns to the office today with her husband to further  discuss surgical options.  We again reviewed the indications, risks and  benefits of use of mechanical prosthesis, assuming that her valves  cannot be repaired.  We discussed the alternative of use of  bioprosthetic tissue valves and their potential for limited durability,  particularly in the mitral position.  Given her age, I have strongly  encouraged [name] to consider use of a mechanical prosthesis if her  mitral valve cannot be repaired.  She understands that this will come  with the need for long-term anticoagulation with Coumadin or some type  of substitute for Coumadin.  She understands that there is associated  risk of both thromboembolism and bleeding even on Coumadin therapy.  Because of her relatively young age, use of bioprosthetic tissue valve  would come with a very high risk for late structural valve deterioration  and failure requiring repeat surgery in the future.  After considerable  discussion, [name] accepts the idea that we will use mechanical  prosthesis to replace her valve.  She understands that this will mean  that she should no longer consider having children in the future, as  well.   We also discussed the possibility of alternative surgical approaches.  I  have reminded them that a conventional sternotomy would probably come  with very low risk and more expeditious surgery.  She is very  interested  in a minimally invasive approach.  She understands that this will come  with the potential for need to convert to a full sternotomy and/or  transverse sternotomy depending upon findings intraoperatively.  Nonetheless, because of her relatively thin chest wall and her young  age, I suspect that she is a fairly good candidate for use of minimally  invasive approach under the circumstances.   [name] and her husband both understand and accept all associated risks of  surgery including, but not limited to, risk of death, stroke, myocardial  infarction, congestive heart failure, respiratory failure, bleeding  requiring blood transfusion, arrhythmia, heart block with bradycardia  requiring permanent pacemaker, late complications related to valve  replacement such as perivalvular leak or infection.  All their questions  have been addressed.  We plan to proceed with surgery tomorrow as  originally planned.   Salvatore Decent. Cornelius Moras, M.D.  Electronically Signed   CHO/MEDQ  D:  03/25/2009  T:  03/25/2009  Job:  [id number]   cc:   Sheliah Mends, MD

## 2011-01-27 NOTE — H&P (Signed)
[name], ZMUDA NO.:  [id number]   MEDICAL RECORD NO.:  [id number]          PATIENT TYPE:  EMS   LOCATION:  MAJO                         FACILITY:  [facility]   PHYSICIAN:  Sheliah Mends, MD      DATE OF BIRTH:  [birth year]   DATE OF ADMISSION:  04/03/2009  DATE OF DISCHARGE:                              HISTORY & PHYSICAL   CHIEF COMPLAINT:  Fever, chills and fast heart rate.   A 30 year old married Asian female with recent aortic valve and mitral  valve replacement done March 26, 2009 due to mild mitral stenosis with  severe mitral regurgitation and moderate to severe aortic regurgitation.  She had replacement with mechanical valve.  She did have some bleeding  postoperatively and went back to the OR for evaluation.  She had a 21 mm  Sorin CarboMedics Top Hat aortic valve and a 33 mm Sorin [employer]boMedics  OptiForm mitral mechanical prosthesis.   Her discharge medications and current medications as she was discharged  April 02, 2009:  1. Aspirin 325 mg daily.  2. Coumadin.  3. Lisinopril 10 mg daily.  4. Toprol XL 25 daily.  5. Lasix 40 mg daily for 10 days.  6. Potassium chloride 20 mEq daily for 10 days.  7. Oxycodone 5 mg every 4 - 6 hours as needed.   ALLERGIES:  NO KNOWN DRUG ALLERGIES.   PAST MEDICAL HISTORY:  1. Severe MR and severe aortic regurgitation, possibly secondary to      rheumatic etiology.  EF was 50%.  2. The patient was having episodic shortness of breath and chest      discomfort prior to surgery.  3. She has does have dyslipidemia.  4. Has had two successful pregnancies.   SOCIAL HISTORY:  Married, two children.  No use of tobacco or alcohol.  She does not exercise.   FAMILY HISTORY:  Without premature coronary disease, sudden death or  aortic dissection.  None at all.   REVIEW OF SYSTEMS:  Negative except for as described under HPI.   HISTORY OF PRESENT ILLNESS:  Patient was discharged April 02, 2009.  Did  well initially but  began coughing on the evening of April 02, 2009.  Cough was more harsh than usual and almost felt as if it was rupturing  out of her chest.  She could sleep secondary to the cough.  On the  morning of April 03, 2009 she had tachycardia with rapid ventricular  response, her chest was moving rapidly.  She did develop a fever of  100.4.  She eventually came to the emergency room, was found to be in  atrial fib with rapid ventricular response.  Initially given IV Cardizem  with some swelling of the A-fib down to 125 but with movement up to 160  again.  Here in the emergency room with Dr. Garen Lah present we gave 150  mg bolus amiodarone IV and a drip at 1 mg per minute.  We will repeat  that bolus if heart rate does not slow down and discontinue the  Cardizem.  The patient also complains  of pain on the right side and  oozing from the previous chest tube sites under her right breast.  Also  somewhat difficult to move her right arm, left arm moves easily.   Past medical history, family history, social history, outpatient  medications as stated.   VITAL SIGNS:  Currently blood pressure 115/60, pulse 101 - 160,  respirations 18, temperature 98.6, oxygen saturation 93% on room air.  GENERAL:  Alert, oriented female, pleasant affect though somewhat  anxious as to be expected.  SKIN:  Warm and dry.  Brisk capillary refill.  Under her right breast  there is ecchymosis and areas of previous chest tube sites with slight  oozing of serosanguineous, very minimal.  HEENT:  Normocephalic.  Pupils equal, round, reactive to light.  NECK:  Supple, no JVD.  HEART:  Rapid irregular heart rate, soft murmur and brisk click of the  valve.  LUNGS:  Decreased breath sounds in the right base, left clear.  ABDOMEN:  Soft, nontender, positive bowel sounds.  No masses palpated.  EXTREMITIES:  No edema, pedal pulses are 2+.  NEURO:  Alert and oriented x3.  Moves all extremities.   LABORATORY VALUES:  Hemoglobin 12.6,  hematocrit 36.9, WBC 8.6, platelets  279, neutrophils 83, lymphs 7, monos 10, eos zero, sodium 138, potassium  4.1, chloride 104, CO2 - 21, glucose 116, BUN 4, creatinine 0.5, calcium  9.1.  Other labs are still pending.   Chest x-ray status post valve replacement:  There is cardiomegaly and  bibasilar opacities are again noted, compatible with atelectasis, small  right pleural effusion, stable since prior study.   IMPRESSION:  1. Atrial fibrillation, rapid ventricular response.  2. Status post mechanical mitral and aortic valve replacement,      discharged April 02, 2009.  3. One episode of fever, blood cultures pending.   PLAN:  Will empirically give Rocephin and new valve replacement and  fever unless Dr. Cornelius Moras feels otherwise.  Repeat PA and lateral chest x-  ray in the morning.  Dr. Cornelius Moras has been consulted for evaluation as well  and we will continue IV amiodarone for now.  She is on Coumadin, we will  wait until we get her INR, if it is therapeutic we will not add heparin.      [name]. Annie Paras, N.P.      Sheliah Mends, MD  Electronically Signed    LRI/MEDQ  D:  04/03/2009  T:  04/03/2009  Job:  [id number]   cc:   Salvatore Decent. Cornelius Moras, M.D.  Jackie Plum, M.D.

## 2011-01-27 NOTE — Op Note (Signed)
NAMEAVENELL, SELLERS                ACCOUNT NO.:  [id number]   MEDICAL RECORD NO.:  [id number]          PATIENT TYPE:  INP   LOCATION:  2314                         FACILITY:  [facility]   PHYSICIAN:  Guadalupe Maple, M.D.  DATE OF BIRTH:  [birth year]   DATE OF PROCEDURE:  03/26/2009  DATE OF DISCHARGE:                               OPERATIVE REPORT   PROCEDURE:  Intraoperative transesophageal echocardiography.   HISTORY:  Ms. Leaner Morici is a 30 year old Asian female with a history  of severe aortic insufficiency and mitral stenosis and mitral  insufficiency of presumed rheumatic etiology who is scheduled to undergo  mitral valve repair or replacement and aortic valve replacement by Dr.  Cornelius Moras.  The intraoperative transesophageal echocardiography was requested  to evaluate the aortic and mitral valves, to assess the left and right  ventricular function and to serve as a monitor for intraoperative volume  status.   The patient was brought to the operating room at [facility] and  general anesthesia was induced without difficulty.  The trachea was  intubated without difficulty.  The transesophageal echocardiography  probe was then inserted into the esophagus following orogastric  suctioning.   IMPRESSION:  Prebypass.   FINDINGS:  1. Aortic valve.  The aortic valve was trileaflet.  The leaflets were      moderately thickened and there was severe aortic insufficiency.      The noncoronary cusp appeared to prolapse during diastole with a      jet of aortic insufficiency which tracked along the      interventricular septum.  This was graded as severe.  The valve      appeared to open well and did not appear stenotic.  The leaflets      did not appear calcified.  The aortic annulus measured 2.0 cm in      diameter.  The sinotubular ridge measured 2.4 cm in diameter.  2. Mitral valve.  The mitral leaflets were thickened and the posterior      leaflet appeared markedly restricted and  there was some restriction      of the anterior leaflet as well and there was also a severe mitral      insufficiency.  The continuous wave Doppler interrogation of the      mitral inflow revealed a mean transvalvular gradient of 4 mmHg and      mitral valve area by pressure half-time of 1.77 cm2.  There was a      jet of mitral insufficiency along the coaptation line that appeared      generalized.  This was graded as severe mitral insufficiency.      There was a very prominent flow acceleration.  3. Left ventricle.  The left ventricular cavity was dilated and      measured 6.9 cm in diameter at the end, a diastole at the mid      papillary level.  The left ventricular end-systolic diameter      measured 5.01 cm.  Left ventricular wall thickness measured 0.9-1.0      cm.  There appeared  to be good contractility in all segments      interrogated.  Ejection fraction was estimated at 55-60%.  However,      the left ventricular cavity was globular appearing.  There was no      thrombus noted in the left ventricular apex.  4. Right ventricle.  The right ventricular size appeared to be within      normal limits with good contractility of the right ventricular free      wall.  5. Tricuspid valve.  The tricuspid valve structurally appeared to be      intact.  There was 1+ to 2+ tricuspid insufficiency.  6. Interatrial septum.  The interatrial septum was intact without      evidence of patent foramen ovale or atrial septal defect.  7. Left atrium.  There was no thrombus noted in the left atrium or      left atrial appendage.  8. Ascending aorta.  The ascending aorta showed no evidence of      atheromatous disease and was not dilated.   POSTBYPASS FINDINGS:  1. Aortic valve.  There was a bileaflet mechanical prosthesis in the      aortic position.  However, due to a acoustic shadowing from the      mitral prosthesis it was very difficult to visualize the aortic      prosthesis, but no aortic  insufficiency could be appreciated and      both leaflets appeared to open.  2. Mitral valve.  The mitral position was a bileaflet mechanical      prosthesis which was better visualized than the aortic prosthesis.      There were normal-appearing washing jets of mitral insufficiency      which appeared consistent with a normal functioning bileaflet      mechanical prosthesis.  There were no perivalvular jets noted.      Both leaflets appeared to open normally.  3. Left ventricle.  There was a dyssynchronous pattern of left      ventricular contractility consistent with ventricular pacing and      the ventricle initially was underfilled but with volume replacement      began to show better filling and ejection fraction was estimated at      50-55%.           ______________________________  Guadalupe Maple, M.D.     DCJ/MEDQ  D:  03/27/2009  T:  03/28/2009  Job:  [id number]

## 2011-02-03 ENCOUNTER — Other Ambulatory Visit: Payer: Self-pay

## 2011-02-03 ENCOUNTER — Other Ambulatory Visit (HOSPITAL_COMMUNITY)
Admission: RE | Admit: 2011-02-03 | Discharge: 2011-02-03 | Disposition: A | Discharge status: Home / Self Care | Payer: BC Managed Care – PPO | Source: Ambulatory Visit | Attending: Obstetrics and Gynecology | Admitting: Obstetrics and Gynecology

## 2011-02-03 DIAGNOSIS — Z124 Encounter for screening for malignant neoplasm of cervix: Secondary | ICD-10-CM | POA: Insufficient documentation

## 2012-01-13 HISTORY — PX: NM MYOVIEW LTD: HXRAD82

## 2012-01-13 HISTORY — PX: TRANSTHORACIC ECHOCARDIOGRAM: SHX275

## 2012-01-18 ENCOUNTER — Other Ambulatory Visit (HOSPITAL_COMMUNITY)
Admission: RE | Admit: 2012-01-18 | Discharge: 2012-01-18 | Disposition: A | Discharge status: Home / Self Care | Payer: BC Managed Care – PPO | Source: Ambulatory Visit | Attending: Obstetrics and Gynecology | Admitting: Obstetrics and Gynecology

## 2012-01-18 ENCOUNTER — Other Ambulatory Visit: Payer: Self-pay | Admitting: Obstetrics and Gynecology

## 2012-01-18 DIAGNOSIS — Z01419 Encounter for gynecological examination (general) (routine) without abnormal findings: Secondary | ICD-10-CM | POA: Insufficient documentation

## 2012-12-02 ENCOUNTER — Ambulatory Visit: Payer: Self-pay | Admitting: Cardiology

## 2012-12-02 DIAGNOSIS — Z952 Presence of prosthetic heart valve: Secondary | ICD-10-CM

## 2012-12-02 DIAGNOSIS — Z7901 Long term (current) use of anticoagulants: Secondary | ICD-10-CM

## 2012-12-02 HISTORY — DX: Presence of prosthetic heart valve: Z95.2

## 2012-12-02 HISTORY — DX: Long term (current) use of anticoagulants: Z79.01

## 2013-01-23 ENCOUNTER — Other Ambulatory Visit: Payer: Self-pay | Admitting: Obstetrics and Gynecology

## 2013-01-23 ENCOUNTER — Other Ambulatory Visit (HOSPITAL_COMMUNITY)
Admission: RE | Admit: 2013-01-23 | Discharge: 2013-01-23 | Disposition: A | Discharge status: Home / Self Care | Payer: BC Managed Care – PPO | Source: Ambulatory Visit | Attending: Obstetrics and Gynecology | Admitting: Obstetrics and Gynecology

## 2013-01-23 DIAGNOSIS — Z01419 Encounter for gynecological examination (general) (routine) without abnormal findings: Secondary | ICD-10-CM | POA: Insufficient documentation

## 2013-01-23 DIAGNOSIS — Z1151 Encounter for screening for human papillomavirus (HPV): Secondary | ICD-10-CM | POA: Insufficient documentation

## 2013-02-21 ENCOUNTER — Ambulatory Visit (INDEPENDENT_AMBULATORY_CARE_PROVIDER_SITE_OTHER): Payer: Self-pay | Admitting: Pharmacist Clinician (PhC)/ Clinical Pharmacy Specialist

## 2013-02-21 DIAGNOSIS — Z954 Presence of other heart-valve replacement: Secondary | ICD-10-CM

## 2013-02-21 DIAGNOSIS — Z952 Presence of prosthetic heart valve: Secondary | ICD-10-CM

## 2013-02-21 DIAGNOSIS — Z7901 Long term (current) use of anticoagulants: Secondary | ICD-10-CM

## 2013-03-02 ENCOUNTER — Telehealth: Payer: Self-pay | Admitting: *Deleted

## 2013-03-02 NOTE — Telephone Encounter (Signed)
Fax received and placed in K. Alvstad, PharmD's inbox w/ paper chart.  Message forwarded to K. Alvstad, PharmD.

## 2013-03-02 NOTE — Telephone Encounter (Signed)
Pt in process of switching from [facility] due to insurance concerns.  Told Alere that we would hold Rx for monitoring until pt lets [place] know what her plans are.  LMOM for Konner to call me back

## 2013-03-02 NOTE — Telephone Encounter (Signed)
Voicemail received that [facility] w/ [facility] Monitoring wanted to know if pt is still being monitored.    Returned call and spoke w/ Marcelino Duster.  Stated Britta Mccreedy was calling to get an updated Rx for pt.  Will fax form.

## 2013-03-21 ENCOUNTER — Telehealth: Payer: Self-pay | Admitting: Pharmacist Clinician (PhC)/ Clinical Pharmacy Specialist

## 2013-03-21 NOTE — Telephone Encounter (Signed)
She got in touch with [facility] can finish the rest of her strips!Please call!

## 2013-03-21 NOTE — Telephone Encounter (Signed)
Pt has 6 strips already paid for by insurance, will continue to check monthly until supply used up.

## 2013-04-03 ENCOUNTER — Ambulatory Visit (INDEPENDENT_AMBULATORY_CARE_PROVIDER_SITE_OTHER): Payer: BC Managed Care – PPO | Admitting: Cardiology

## 2013-04-03 ENCOUNTER — Encounter: Payer: Self-pay | Admitting: Cardiology

## 2013-04-03 VITALS — BP 128/82 | HR 72 | Ht 63.0 in | Wt 138.2 lb

## 2013-04-03 DIAGNOSIS — I10 Essential (primary) hypertension: Secondary | ICD-10-CM

## 2013-04-03 DIAGNOSIS — Z954 Presence of other heart-valve replacement: Secondary | ICD-10-CM

## 2013-04-03 DIAGNOSIS — Z7901 Long term (current) use of anticoagulants: Secondary | ICD-10-CM

## 2013-04-03 DIAGNOSIS — Z952 Presence of prosthetic heart valve: Secondary | ICD-10-CM

## 2013-04-03 NOTE — Assessment & Plan Note (Signed)
BP seems to be doing well after weaning off the ARB.  Would only restart if HF symptoms recur.

## 2013-04-03 NOTE — Assessment & Plan Note (Addendum)
Stable of Coumadin.  Unfortunately - insurance no longer covering her home monitoring kit. Now following with Phillips Hay @ [facility]

## 2013-04-03 NOTE — Assessment & Plan Note (Addendum)
Sounds normal on exam.  No MR murmur.  No CHF symptoms.  Plan: f/u 2D Echo and 2015

## 2013-04-03 NOTE — Patient Instructions (Addendum)
You seem to be doing great.  Blood pressure & Heart Rate are good. Valves sound like they are functioning well.  We just need to schedule your yearly Echocardiogram to follow your valves.  Otherwise, I will see you back in ~ year.  Marykay Lex, MD

## 2013-04-03 NOTE — Progress Notes (Addendum)
Patient ID: [name], female   DOB: 1981/05/03, 32 y.o.   MRN: [id number] Clinic Note: Chief Complaint  Patient presents with  . Annual Exam    no chest pain,no sob ,no edema, labs done May 2014 chol  was good will bring in results   HPI: [name] is a 32 y.o. female with a PMH below who presents today for annual followup from her dual valve replacement in 2010.  She is very fortunate young woman who became very sick with acute worsening of both aortic and mitral valve complications of rheumatic heart disease, and up with pulled out of the replacement surgery complicated by mediastinitis and now a tender keloid scar.  She also had postop A. fib that resolved.  Has taken her couple years to just physically get back to herself.  I think some of it was her ventricular function recovering.  Interval History: She says that this year has been the best year she's had since her surgery.  She really started to get fully recovered.  He would be to leave the notes is some tenderness along the scar.  Last year she is having some unusual chest discomfort suite and a nuclear stress test that was negative.  I think that really reassured her, she said since that she is not had any more chest tightness.  She denies any more palpitations either.  He is exercising routinely without any problems.  No chest pain or shortness of breath at rest or with exertion.  No PND, orthopnea or edema.  No lightheadedness, dizziness, wooziness, syncope/near syncope.  Last year we started weaning her off blood pressure medications.  She is now fully off losartan and her blood pressures are well controlled.  Her lipids have also been very well-controlled.  She said her last check back in May. She is doing fine on warfarin but no melena, hematochezia or hematuria.  Past Medical History  Diagnosis Date  . Rheumatic valvular disease July 2010    Status post AVR (21 mm sore and CarboMedics mechanical) and MVR (32 mm sore and  paramedics Opteform mechanical) for Aortic and mitral valve stenosis with regurgitation  . Seasonal allergies   . Borderline hypertension   . Postoperative atrial fibrillation     No recurrence   Prior Cardiac Evaluation and Past Surgical History: Past Surgical History  Procedure Laterality Date  . Avr and mvr  July 2010    Dr. Barry Dienes: 21 mm Sorin Carbomedics mechanical aortic valve ; 33 mm Sorin Carbomedics Optiform mechanical prosthetic mitral valve  . Transthoracic echocardiogram  May 2013    EF 50-55% with no regional wall motion abnormalities;Well-seated aortic valve with normal gradients (peak 41, mean 23 mmHg); well-seated mitral prosthetic valve no regurgitation normal gradients; mild concentric LVH with impaired relaxation and moderate dilated left atrium.    [facility]  May 2013    No ischemia or infarction  . Cardiac catheterization  June 2010    Normal coronary arteries   Allergies  Allergen Reactions  . Ace Inhibitors Cough  . Oxycodone Other (See Comments)    hallucinate   Current Outpatient Prescriptions  Medication Sig Dispense Refill  . warfarin (COUMADIN) 7.5 MG tablet Take 7.5 mg by mouth daily. Alternate with 11.25 mg every other day       No current facility-administered medications for this visit.   Social History Narrative   She is a married mother of 2 who exercises regularly for 30 minutes every  other day without difficulty.  Has not taken does not smoke.      Her initial postop course from bowel surgery was complicated by mediastinitis and then a large keloid scar along the midline incision.  She also had postop A. fib that resolved.  She says he finally starting to get "back to herself this past year "    ROS: A comprehensive Review of Systems - Negative except mild tenderness along her scar line.  PHYSICAL EXAM BP 128/82  Pulse 72  Ht 5\' 3"  (1.6 m)  Wt 138 lb 3.2 oz (62.687 kg)  BMI 24.49 kg/m2 General appearance: alert, cooperative, no  distress and healthy appearing.  Well nourished & well groomed. Answers ?s appropriately Neck: no adenopathy, no carotid bruit, no JVD, supple, symmetrical, trachea midline, thyroid not enlarged, symmetric, no tenderness/mass/nodules and radiated Aortic murmur Lungs: clear to auscultation bilaterally, normal percussion bilaterally and non-labored. Heart: regular rate and rhythm, S1: normal prosthetic S1, S2: normal prosthetic S2, no S3 or S4 and systolic murmur: systolic ejection 2/6, crescendo and decrescendo at 2nd right intercostal space Abdomen: soft, non-tender; bowel sounds normal; no masses,  no organomegaly Extremities: extremities normal, atraumatic, no cyanosis or edema, no edema, redness or tenderness in the calves or thighs and no ulcers, gangrene or trophic changes Pulses: 2+ and symmetric Neurologic: Grossly normal  [id number] today: Yes Rate:72 , Rhythm: NSR, RSR', otherwise normal ECG; no new changes.  Recent Labs: Checked by PCP - was told that they looked "good"  ASSESSMENT: Doing great!!  H/O mitral valve replacement Sounds normal on exam.  No MR murmur.  No CHF symptoms.  Plan: f/u 2D Echo and 2015  H/O aortic valve replacement Sounds normal on exam.  No MR murmur.  No CHF symptoms.  Plan: f/u 2D Echo in 2015  Long term (current) use of anticoagulants Stable of Coumadin.  Unfortunately - insurance no longer covering her home monitoring kit. Now following with Phillips Hay @ [facility]   Essential hypertension BP seems to be doing well after weaning off the ARB.  Would only restart if HF symptoms recur.   PLAN: Per problem list. Orders Placed This Encounter  Procedures  . EKG 12-Lead  . 2D Echocardiogram without contrast    S/p Mitral & Aortic Vavle replacement - annular follow-up    Standing Status: Future     Number of Occurrences:      Standing Expiration Date: 04/03/2014    Order Specific Question:  Type of Echo     Answer:  Complete    Order Specific Question:  Where should this test be performed    Answer:  [facility]    Order Specific Question:  Reason for exam-Echo    Answer:  Other - See Comments Section   Followup: 1 yr  DAVID W. Herbie Baltimore, M.D., M.S. [facility] [address] [place], [place]  [address]  [phone] Pager # [phone]

## 2013-04-03 NOTE — Assessment & Plan Note (Addendum)
Sounds normal on exam.  No MR murmur.  No CHF symptoms.  Plan: f/u 2D Echo in 2015

## 2013-04-21 ENCOUNTER — Encounter: Payer: Self-pay | Admitting: Cardiology

## 2013-04-24 ENCOUNTER — Ambulatory Visit (HOSPITAL_COMMUNITY)
Admission: RE | Admit: 2013-04-24 | Discharge: 2013-04-24 | Disposition: A | Discharge status: Home / Self Care | Payer: BC Managed Care – PPO | Source: Ambulatory Visit | Attending: Cardiovascular Disease | Admitting: Cardiovascular Disease

## 2013-04-24 DIAGNOSIS — I059 Rheumatic mitral valve disease, unspecified: Secondary | ICD-10-CM | POA: Insufficient documentation

## 2013-04-24 DIAGNOSIS — Z952 Presence of prosthetic heart valve: Secondary | ICD-10-CM

## 2013-04-24 DIAGNOSIS — I379 Nonrheumatic pulmonary valve disorder, unspecified: Secondary | ICD-10-CM | POA: Insufficient documentation

## 2013-04-24 DIAGNOSIS — I079 Rheumatic tricuspid valve disease, unspecified: Secondary | ICD-10-CM | POA: Insufficient documentation

## 2013-04-24 DIAGNOSIS — I359 Nonrheumatic aortic valve disorder, unspecified: Secondary | ICD-10-CM | POA: Insufficient documentation

## 2013-04-24 DIAGNOSIS — Z954 Presence of other heart-valve replacement: Secondary | ICD-10-CM | POA: Insufficient documentation

## 2013-04-24 NOTE — Progress Notes (Signed)
2D Echo Performed 04/24/2013    Tracey Stewart, RCS  

## 2013-04-27 LAB — POCT INR: INR: 2.8

## 2013-05-03 ENCOUNTER — Telehealth: Payer: Self-pay | Admitting: *Deleted

## 2013-05-03 NOTE — Telephone Encounter (Signed)
Message copied by Tobin Chad on Wed May 03, 2013  3:54 PM ------      Message from: [employer] [facility], DAVID      Created: Sun Apr 30, 2013  2:20 PM       Good looking Echo; valves are functioning well & LV function looks good.            Marykay Lex, MD       ------

## 2013-05-03 NOTE — Telephone Encounter (Signed)
Results given. Verbalized understanding. 

## 2013-05-11 ENCOUNTER — Ambulatory Visit (INDEPENDENT_AMBULATORY_CARE_PROVIDER_SITE_OTHER): Payer: Self-pay | Admitting: Pharmacist Clinician (PhC)/ Clinical Pharmacy Specialist

## 2013-05-11 DIAGNOSIS — Z7901 Long term (current) use of anticoagulants: Secondary | ICD-10-CM

## 2013-05-11 DIAGNOSIS — Z952 Presence of prosthetic heart valve: Secondary | ICD-10-CM

## 2013-05-11 DIAGNOSIS — Z954 Presence of other heart-valve replacement: Secondary | ICD-10-CM

## 2013-11-22 ENCOUNTER — Telehealth: Payer: Self-pay | Admitting: Pharmacist Clinician (PhC)/ Clinical Pharmacy Specialist

## 2013-11-22 NOTE — Telephone Encounter (Signed)
Pt left VM Tuesday afternoon, needs refill on warfarin, also concerned about what to do, will be traveling to [place] to see family for 3-4 weeks this summer [facility] to [facility], which pharmacy to refill warfarin, also, can check INR prior to trip and when returns, no need to take INR meter with her to [place]

## 2013-11-23 ENCOUNTER — Other Ambulatory Visit: Payer: Self-pay | Admitting: Pharmacist Clinician (PhC)/ Clinical Pharmacy Specialist

## 2013-11-23 MED ORDER — WARFARIN SODIUM 7.5 MG PO TABS: ORAL_TABLET | ORAL | Status: DC

## 2013-11-23 NOTE — Telephone Encounter (Signed)
Pt returning call to my VM.  Has been checking INR on 2nd of each month.  Jan 2.7, Feb 2.9, March 2.7.  Taking 7.5mg  qod and 11.25mg  qod.  Pharmacy is [facility] on [address]

## 2013-12-14 LAB — POCT INR: INR: 2.8

## 2014-01-15 LAB — POCT INR
INR: 2.9
INR: 2.9

## 2014-04-05 ENCOUNTER — Ambulatory Visit (INDEPENDENT_AMBULATORY_CARE_PROVIDER_SITE_OTHER): Payer: BC Managed Care – PPO | Admitting: Pharmacist Clinician (PhC)/ Clinical Pharmacy Specialist

## 2014-04-05 DIAGNOSIS — Z954 Presence of other heart-valve replacement: Secondary | ICD-10-CM

## 2014-04-05 DIAGNOSIS — Z7901 Long term (current) use of anticoagulants: Secondary | ICD-10-CM

## 2014-04-05 DIAGNOSIS — Z952 Presence of prosthetic heart valve: Secondary | ICD-10-CM

## 2014-04-09 ENCOUNTER — Ambulatory Visit: Payer: BC Managed Care – PPO | Admitting: Pharmacist Clinician (PhC)/ Clinical Pharmacy Specialist

## 2014-04-09 DIAGNOSIS — Z7901 Long term (current) use of anticoagulants: Secondary | ICD-10-CM

## 2014-04-09 DIAGNOSIS — Z952 Presence of prosthetic heart valve: Secondary | ICD-10-CM

## 2014-05-14 ENCOUNTER — Ambulatory Visit (INDEPENDENT_AMBULATORY_CARE_PROVIDER_SITE_OTHER): Payer: BC Managed Care – PPO | Admitting: Cardiology

## 2014-05-14 ENCOUNTER — Encounter: Payer: Self-pay | Admitting: Cardiology

## 2014-05-14 VITALS — BP 120/84 | HR 76 | Ht 63.0 in | Wt 141.1 lb

## 2014-05-14 DIAGNOSIS — Z952 Presence of prosthetic heart valve: Secondary | ICD-10-CM

## 2014-05-14 DIAGNOSIS — Z954 Presence of other heart-valve replacement: Secondary | ICD-10-CM

## 2014-05-14 DIAGNOSIS — I10 Essential (primary) hypertension: Secondary | ICD-10-CM

## 2014-05-14 DIAGNOSIS — Z7901 Long term (current) use of anticoagulants: Secondary | ICD-10-CM

## 2014-05-14 MED ORDER — WARFARIN SODIUM 7.5 MG PO TABS: ORAL_TABLET | ORAL | Status: DC

## 2014-05-14 NOTE — Patient Instructions (Signed)
Your physician wants you to follow-up in 12 months Dr Herbie Baltimore. You will receive a reminder letter in the mail two months in advance. If you don't receive a letter, please call our office to schedule the follow-up appointment.  Continue with current medications.

## 2014-05-18 ENCOUNTER — Encounter: Payer: Self-pay | Admitting: Cardiology

## 2014-05-18 NOTE — Assessment & Plan Note (Signed)
Monitors her warfarin levels at home with the help of our pharmacists. Stable with no bleeding complications.

## 2014-05-18 NOTE — Assessment & Plan Note (Signed)
Normal mechanical sound. Minimal regurgitation on echo and not heard on exam.  Probably don't need a followup echo this year. Would routinely monitor every 3 years which would be in the next one would be 2017, unless new symptoms arise

## 2014-05-18 NOTE — Assessment & Plan Note (Signed)
Again sounds normal exam with minimal regurgitation on echo. Followup echo every 3 years

## 2014-05-18 NOTE — Progress Notes (Signed)
PCP: Pcp Not In System  Clinic Note: Chief Complaint  Patient presents with  . 13 month vist    no chest pain , no sob , no edema , -- wants to CHIA-C is okay to use with coumadin   HPI: [name] is a 33 y.o. female with a Cardiovascular Problem List below who presents today for followup of her dual bowel replacement surgery her most recent echocardiogram was August 2014 which showed normally functioning valves and normal echo overall.  Interval History: This is a very difficult is doing quite well without major cardiac complaints. Her only issue is migraine headaches. She says occasionally she'll feels palpitations or catch in her breath, but otherwise is not noticing any chest tightness or pressure with rest or exertion. No exertional dyspnea. No rapid irregular heart beats beyond the very rare episodes. No prolonged irregular heartbeats. No PND, orthopnea or edema. No syncope/near syncope TIA/amaurosis fugax. No melena, hematochezia, hematuria, epistaxis.  Past Medical History  Diagnosis Date  . Rheumatic valvular disease July 2010; 04/2013    Status post AVR (21 mm Sorin CarboMedics mechanical) and MVR (32 mm Sorin Carbometrics Opteform mechanical) for Aortic and mitral valve stenosis with regurgitation;; b) F/u Echo 04/2013: Normal LV size and thickness. EF 60-65%. 21 mm mechanical prosthetic AoV & 33 mm mechanical MV present and functioning normally.  trivial regurgitation. No change since 2013  . Seasonal allergies   . Borderline hypertension   . Postoperative atrial fibrillation     No recurrence   Prior Cardiac Evaluation and Past Surgical History: Past Surgical History  Procedure Laterality Date  . Avr and mvr  July 2010    Dr. Barry Dienes: 21 mm Sorin Carbomedics mechanical aortic valve ; 33 mm Sorin Carbomedics Optiform mechanical prosthetic mitral valve  . Transthoracic echocardiogram  May 2013    EF 50-55% with no regional wall motion abnormalities;Well-seated aortic valve  with normal gradients (peak 41, mean 23 mmHg); well-seated mitral prosthetic valve no regurgitation normal gradients; mild concentric LVH with impaired relaxation and moderate dilated left atrium.    Marland Kitchen Nm myoview ltd  May 2013    No ischemia or infarction  . Cardiac catheterization  June 2010    Normal coronary arteries   MEDICATIONS AND ALLERGIES REVIEWED IN EPIC No Change in Social and Family History  ROS: A comprehensive Review of Systems - was performed Review of Systems  Eyes: Negative.   Respiratory: Negative for cough, hemoptysis, sputum production, shortness of breath and wheezing.   Cardiovascular: Positive for palpitations.  Gastrointestinal: Negative for blood in stool and melena.  Genitourinary: Negative for hematuria.  Neurological: Positive for headaches.  Endo/Heme/Allergies: Does not bruise/bleed easily.  All other systems reviewed and are negative.  Wt Readings from Last 3 Encounters:  05/14/14 141 lb 1.9 oz (64.012 kg)  04/03/13 138 lb 3.2 oz (62.687 kg)   PHYSICAL EXAM BP 120/84  Pulse 76  Ht  (1.6 m)  Wt 141 lb 1.9 oz (64.012 kg)  BMI 25.00 kg/m2 General appearance: alert, cooperative, no distress and healthy appearing. Well nourished & well groomed. Answers ?s appropriately  Neck: no adenopathy, no carotid bruit, no JVD, supple, symmetrical, trachea midline, thyroid not enlarged, symmetric, no tenderness/mass/nodules and radiated Aortic murmur  Lungs: clear to auscultation bilaterally, normal percussion bilaterally and non-labored.  Heart: regular rate and rhythm, S1: normal prosthetic S1, S2: normal prosthetic S2, no S3 or S4 and systolic murmur: systolic ejection 2/6, crescendo and decrescendo at 2nd right  intercostal space  Abdomen: soft, non-tender; bowel sounds normal; no masses, no organomegaly  Extremities: extremities normal, atraumatic, no cyanosis or edema, no edema, redness or tenderness in the calves or thighs and no ulcers, gangrene or trophic  changes  Pulses: 2+ and symmetric  Neurologic: Grossly normal   Adult ECG Report  Rate: 76 ;  Rhythm: normal sinus rhythm; bilateral atrial enlargement. RSR', inferior T wave inversions.  Narrative Interpretation: Stable EKG  Recent Labs none:   ASSESSMENT / PLAN: H/O mitral valve replacement Normal mechanical sound. Minimal regurgitation on echo and not heard on exam.  Probably don't need a followup echo this year. Would routinely monitor every 3 years which would be in the next one would be 2017, unless new symptoms arise  H/O aortic valve replacement Again sounds normal exam with minimal regurgitation on echo. Followup echo every 3 years  Long term (current) use of anticoagulants Monitors her warfarin levels at home with the help of our pharmacists. Stable with no bleeding complications.  Essential hypertension Really she is doing well with a normal blood pressure off the ARB. Pressures are normal today. No diastolic heart failure symptoms or worsening hypertension, I don't see the need to restart.    Orders Placed This Encounter  Procedures  . EKG 12-Lead   Meds ordered this encounter  Medications  . warfarin (COUMADIN) 7.5 MG tablet    Sig: Take 1 to 1 & 1/2 tablets by mouth daily as directed    Dispense:  135 tablet    Refill:  2    Followup: 12 months  DAVID W. Herbie Baltimore, M.D., M.S. Interventional Cardiologist [facility]

## 2014-05-18 NOTE — Assessment & Plan Note (Signed)
Really she is doing well with a normal blood pressure off the ARB. Pressures are normal today. No diastolic heart failure symptoms or worsening hypertension, I don't see the need to restart.

## 2014-07-13 ENCOUNTER — Telehealth: Payer: Self-pay | Admitting: Cardiology

## 2014-07-13 MED ORDER — AMOXICILLIN 500 MG PO TABS: ORAL_TABLET | ORAL | Status: DC

## 2014-07-13 NOTE — Telephone Encounter (Signed)
Calling to get a pre-med antibiotic for dental work on Monday .please Call

## 2014-07-13 NOTE — Telephone Encounter (Signed)
Spoke with pt, aware script has been sent to the pharm 

## 2015-04-01 ENCOUNTER — Other Ambulatory Visit: Payer: Self-pay | Admitting: Cardiology

## 2015-04-01 NOTE — Telephone Encounter (Signed)
Patient has dry cough and feet are swelling some.  Wants lasix...has appt in September.

## 2015-04-01 NOTE — Telephone Encounter (Signed)
Pt called. She reports dry cough. Slight swelling in feet intermittently that she reports resolves on its own. Notes this has been going on a few weeks that she has noticed.  Advised to f/u w/ PCP regarding cough. Will route concern about swelling to Dr. Herbie BaltimoreHarding. Advised to call again if change in symptoms.  Pt also requested antibiotics to be ordered to pharmacy - MVP prophylaxis for upcoming dental appt - states Jasmine DecemberSharon usually orders these. Not sure what is usually ordered, will cc Jasmine DecemberSharon on this. Pt aware.

## 2015-04-01 NOTE — Telephone Encounter (Signed)
LEFT MESSAGE ON PATIENT - SHE HAS PRESCRIPTION REFILL  ( AMOXIILLCIN )AVAILABLE CALLED PHARMACY AND SPOKE TO TECH TO FILL.

## 2015-04-02 ENCOUNTER — Encounter: Payer: Self-pay | Admitting: Cardiology

## 2015-04-26 ENCOUNTER — Telehealth: Payer: Self-pay | Admitting: Cardiology

## 2015-04-26 NOTE — Telephone Encounter (Signed)
Patient states she has been having left sided chest pain for 3-4 days. No arm or neck pain but does have pain in her upper back---all of this comes and goes---no nausea or sweating.  Please call.

## 2015-04-26 NOTE — Telephone Encounter (Signed)
Has Flex appointment with Tereso Newcomer at [facility]  Patient has pain under left arm pit into left chest and scapula.  She remains on coumadin No shortness of breath or cough Pain intermittent Nothing makes it worse or better Non-reproduce able Has lifted 30 lbs of weight continuously in the evening.  No change in intensity, weight or unusual stress or pulling  Next appointment with Dr. Herbie Baltimore mid-September Concerned about her heart and wishes to have an OV earlier

## 2015-04-26 NOTE — Telephone Encounter (Signed)
Patient is returning a call from Mary.

## 2015-04-28 NOTE — Progress Notes (Addendum)
Cardiology Office Note   Date:  04/29/2015   ID:  [name], DOB 12-31-1980, MRN [id number]  PCP:  Pcp Not In System  Cardiologist:  Dr. Bryan Lemma   Electrophysiologist:  n/a  Chief Complaint  Patient presents with  . Chest Pain     History of Present Illness: [name] is a 34 y.o. female with a hx of rheumatic heart disease s/p mechanical AVR + mechanical MVR in 2010.   Last seen by Dr. Bryan Lemma 04/2014.  Echo in 2014 demonstrated normally functioning mechanical aortic and mitral valves.    She is here by herself. She had 2 episodes of chest pain last week. These occurred at rest.  She had a brief episode that lasted only seconds.  The last episode was several days ago and lasted several hours.   It was a tight sensation and was located in her L chest.  She had no associated symptoms. She exercises on a daily basis.  She rides a stationary bike and lifts weights.  She denies exertional chest pain or DOE.  She denies orthopnea, PND, edema. She denies syncope.  She denies chest pain associated with meals.  She denies chest pain worsening with changes in positioning.  She denies pleuritic pain.  She manages her own Coumadin. Recent INR was 2.7.  Of note, she does have occasional palpitations.  These occur especially at night.  She denies sustained rapid palpitations.     Studies/Reports Reviewed Today:  Echo 04/24/13 EF 60-65%, no RWMA, paradoxical ventricular septal motion, Aortic valve: A 21mm Sorin mechanical prosthesis functioning normally, Trivial AI, Mitral valve:  33mm mechanical prosthesis functioning normally, mild MR, mild LAE Impressions:- Prosthetic valvew gradients are not changed since05/14/2013 study.  Myoview 01/26/12 EF 58%, no ischemia  LHC 02/2009 Normal coronary arteries Severe MR   Past Medical History  Diagnosis Date  . Rheumatic valvular disease July 2010; 04/2013    Status post AVR (21 mm Sorin CarboMedics mechanical) and MVR (32 mm  Sorin Carbometrics Opteform mechanical) for Aortic and mitral valve stenosis with regurgitation;; b) F/u Echo 04/2013: Normal LV size and thickness. EF 60-65%. 21 mm mechanical prosthetic AoV & 33 mm mechanical MV present and functioning normally.  trivial regurgitation. No change since 2013  . Seasonal allergies   . Borderline hypertension   . Postoperative atrial fibrillation     No recurrence    Past Surgical History  Procedure Laterality Date  . Avr and mvr  July 2010    Dr. Barry Dienes: 21 mm Sorin Carbomedics mechanical aortic valve ; 33 mm Sorin Carbomedics Optiform mechanical prosthetic mitral valve  . Transthoracic echocardiogram  May 2013    EF 50-55% with no regional wall motion abnormalities;Well-seated aortic valve with normal gradients (peak 41, mean 23 mmHg); well-seated mitral prosthetic valve no regurgitation normal gradients; mild concentric LVH with impaired relaxation and moderate dilated left atrium.    Marland Kitchen Nm myoview ltd  May 2013    No ischemia or infarction  . Cardiac catheterization  June 2010    Normal coronary arteries     Current Outpatient Prescriptions  Medication Sig Dispense Refill  . amoxicillin (AMOXIL) 500 MG tablet Take all 4 tablets one hour prior to procedure (Patient taking differently: Take 500 mg by mouth daily as needed (for dental procedure). Take all 4 tablets one hour prior to procedure) 4 tablet 6  . warfarin (COUMADIN) 7.5 MG tablet Take 1 to 1 & 1/2 tablets by mouth daily as  directed 135 tablet 2  . aspirin EC 81 MG tablet Take 1 tablet (81 mg total) by mouth daily.     No current facility-administered medications for this visit.    Allergies:   Ace inhibitors and Oxycodone    Social History:  The patient  reports that she has never smoked. She does not have any smokeless tobacco history on file. She reports that she does not drink alcohol or use illicit drugs.   Family History:  The patient's family history includes Heart disease in her  mother; Hyperlipidemia in her father and mother.    ROS:   Please see the history of present illness.   Review of Systems  Cardiovascular: Positive for chest pain, dyspnea on exertion and irregular heartbeat.  Musculoskeletal: Positive for back pain, joint swelling and myalgias.  All other systems reviewed and are negative.     PHYSICAL EXAM: VS:  BP 108/62 mmHg  Pulse 79  Ht 5\' 4"  (1.626 m)  Wt 144 lb 9.6 oz (65.59 kg)  BMI 24.81 kg/m2    Wt Readings from Last 3 Encounters:  04/29/15 144 lb 9.6 oz (65.59 kg)  05/14/14 141 lb 1.9 oz (64.012 kg)  04/03/13 138 lb 3.2 oz (62.687 kg)     GEN: Well nourished, well developed, in no acute distress HEENT: normal Neck: no JVD,   no masses Cardiac:  Mechanical S1, Mechanical S2, RRR; 1/6 systolic murmur RUSB,  no rubs or gallops, no edema   Respiratory:  clear to auscultation bilaterally, no wheezing, rhonchi or rales. GI: soft, nontender, nondistended, + BS MS: no deformity or atrophy Skin: warm and dry  Neuro:  CNs II-XII intact, Strength and sensation are intact Psych: Normal affect   EKG:  EKG is ordered today.  It demonstrates:   NSR, HR 79, normal axis, TWI 2, 3, aVF, RSR' V1-2, QTc 465 ms, no change from prior tracing.    Recent Labs: No results found for requested labs within last 365 days.    Lipid Panel No results found for: CHOL, TRIG, HDL, CHOLHDL, VLDL, LDLCALC, LDLDIRECT    ASSESSMENT AND PLAN:  Other chest pain:  She presents with fairly atypical chest pain.  She did have prolonged tightness in her chest for several hours.  She has persistent inferior TWI on her ECG.  There has been no significant change since her last ECG.  She had no CAD at cardiac cath in 2010.  She exercises regularly without ischemic symptoms.  Last Echo was in 2014.  Will repeat Echo to reassess valve replacements and LV and RV function.  ECGs on a plain treadmill test will likely be abnormal.  I will arrange an ETT-Echo to rule out  ischemia as a cause of her symptoms. Check BMET, CBC.  As long as her ETT-Echo is unremarkable, would consider other causes (MSK chest pain vs GI etiology).    Rheumatic heart disease with H/O aortic valve replacement and H/O mitral valve replacement:  She is s/p mechanical AVR and MVR.  Continue coumadin. She should be on ASA in addition to Coumadin.  I advised her to take ASA 81 mg QD.  Arrange FU echo.    Long term current use of anticoagulant therapy:  She manages her own coumadin.    Palpitations:  She has what sounds like PVCs vs PACs.  Check BMET, TSH, CBC.  If palpitations continue, consider event monitor.       Medication Changes: Current medicines are reviewed at length with the  patient today.  Concerns regarding medicines are as outlined above.  The following changes have been made:   Discontinued Medications   No medications on file   Modified Medications   No medications on file   New Prescriptions   ASPIRIN EC 81 MG TABLET    Take 1 tablet (81 mg total) by mouth daily.    Labs/ tests ordered today include:   Orders Placed This Encounter  Procedures  . Basic Metabolic Panel (BMET)  . CBC w/Diff  . TSH  . EKG 12-Lead  . Echocardiogram  . Echo stress     Disposition:    FU with Dr. Bryan Lemma as scheduled.      Signed, Brynda Rim, MHS 04/29/2015 4:28 PM    [facility] [address] [place], [place], [place]  [address] Phone: [phone]; Fax: [phone]

## 2015-04-29 ENCOUNTER — Encounter: Payer: Self-pay | Admitting: Physician Assistant

## 2015-04-29 ENCOUNTER — Ambulatory Visit (INDEPENDENT_AMBULATORY_CARE_PROVIDER_SITE_OTHER): Payer: BLUE CROSS/BLUE SHIELD | Admitting: Physician Assistant

## 2015-04-29 VITALS — BP 108/62 | HR 79 | Ht 64.0 in | Wt 144.6 lb

## 2015-04-29 DIAGNOSIS — R0789 Other chest pain: Secondary | ICD-10-CM

## 2015-04-29 DIAGNOSIS — Z952 Presence of prosthetic heart valve: Secondary | ICD-10-CM

## 2015-04-29 DIAGNOSIS — I099 Rheumatic heart disease, unspecified: Secondary | ICD-10-CM | POA: Diagnosis not present

## 2015-04-29 DIAGNOSIS — Z954 Presence of other heart-valve replacement: Secondary | ICD-10-CM

## 2015-04-29 DIAGNOSIS — Z7901 Long term (current) use of anticoagulants: Secondary | ICD-10-CM | POA: Diagnosis not present

## 2015-04-29 DIAGNOSIS — R002 Palpitations: Secondary | ICD-10-CM

## 2015-04-29 MED ORDER — ASPIRIN EC 81 MG PO TBEC: 81.0000 mg | DELAYED_RELEASE_TABLET | Freq: Every day | ORAL | Status: DC

## 2015-04-29 NOTE — Patient Instructions (Signed)
Medication Instructions:  1. START ASPIRIN 81 MG DAILY  Labwork: TODAY BMET, CBC W/DIFF, TSH  Testing/Procedures: 1. Your physician has requested that you have an echocardiogram. Echocardiography is a painless test that uses sound waves to create images of your heart. It provides your doctor with information about the size and shape of your heart and how well your heart's chambers and valves are working. This procedure takes approximately one hour. There are no restrictions for this procedure.  Your physician has requested that you have a stress echocardiogram. For further information please visit [email or web address] Please follow instruction sheet as given.    Follow-Up: KEEP YOUR APPT WITH DR. HARDING NEXT MONTH  Any Other Special Instructions Will Be Listed Below (If Applicable).

## 2015-04-30 LAB — CBC WITH DIFFERENTIAL/PLATELET
BASOS ABS: 0 10*3/uL (ref 0.0–0.1)
BASOS PCT: 0.2 % (ref 0.0–3.0)
EOS PCT: 1.9 % (ref 0.0–5.0)
Eosinophils Absolute: 0.1 10*3/uL (ref 0.0–0.7)
HEMATOCRIT: 39.2 % (ref 36.0–46.0)
Hemoglobin: 13 g/dL (ref 12.0–15.0)
LYMPHS ABS: 1.1 10*3/uL (ref 0.7–4.0)
LYMPHS PCT: 19.9 % (ref 12.0–46.0)
MCHC: 33.3 g/dL (ref 30.0–36.0)
MCV: 85.1 fl (ref 78.0–100.0)
MONOS PCT: 6.9 % (ref 3.0–12.0)
Monocytes Absolute: 0.4 10*3/uL (ref 0.1–1.0)
NEUTROS ABS: 3.9 10*3/uL (ref 1.4–7.7)
NEUTROS PCT: 71.1 % (ref 43.0–77.0)
PLATELETS: 182 10*3/uL (ref 150.0–400.0)
RBC: 4.6 Mil/uL (ref 3.87–5.11)
RDW: 14.7 % (ref 11.5–15.5)
WBC: 5.5 10*3/uL (ref 4.0–10.5)

## 2015-04-30 LAB — BASIC METABOLIC PANEL
BUN: 10 mg/dL (ref 6–23)
CALCIUM: 9 mg/dL (ref 8.4–10.5)
CHLORIDE: 103 meq/L (ref 96–112)
CO2: 29 meq/L (ref 19–32)
Creatinine, Ser: 0.64 mg/dL (ref 0.40–1.20)
GFR: 112.48 mL/min (ref >60.00)
GLUCOSE: 95 mg/dL (ref 70–99)
POTASSIUM: 4.4 meq/L (ref 3.5–5.1)
SODIUM: 138 meq/L (ref 135–145)

## 2015-04-30 LAB — TSH: TSH: 1.18 u[IU]/mL (ref 0.35–4.50)

## 2015-05-01 ENCOUNTER — Telehealth: Payer: Self-pay | Admitting: Cardiology

## 2015-05-01 ENCOUNTER — Telehealth: Payer: Self-pay | Admitting: *Deleted

## 2015-05-01 NOTE — Telephone Encounter (Signed)
lmom lab work all normal

## 2015-05-01 NOTE — Telephone Encounter (Signed)
Closed encounter °

## 2015-05-14 ENCOUNTER — Encounter: Payer: Self-pay | Admitting: Physician Assistant

## 2015-05-14 ENCOUNTER — Other Ambulatory Visit: Payer: Self-pay

## 2015-05-14 ENCOUNTER — Ambulatory Visit (HOSPITAL_BASED_OUTPATIENT_CLINIC_OR_DEPARTMENT_OTHER): Payer: BLUE CROSS/BLUE SHIELD

## 2015-05-14 ENCOUNTER — Telehealth: Payer: Self-pay | Admitting: *Deleted

## 2015-05-14 ENCOUNTER — Ambulatory Visit (HOSPITAL_COMMUNITY): Payer: BLUE CROSS/BLUE SHIELD | Attending: Cardiology

## 2015-05-14 DIAGNOSIS — R0789 Other chest pain: Secondary | ICD-10-CM

## 2015-05-14 DIAGNOSIS — Z954 Presence of other heart-valve replacement: Secondary | ICD-10-CM | POA: Diagnosis not present

## 2015-05-14 DIAGNOSIS — Z952 Presence of prosthetic heart valve: Secondary | ICD-10-CM

## 2015-05-14 DIAGNOSIS — R079 Chest pain, unspecified: Secondary | ICD-10-CM

## 2015-05-14 NOTE — Telephone Encounter (Signed)
Pt notified of echo stress test results by phone with verbal understanding.

## 2015-05-15 ENCOUNTER — Encounter: Payer: Self-pay | Admitting: Physician Assistant

## 2015-05-16 ENCOUNTER — Telehealth: Payer: Self-pay | Admitting: *Deleted

## 2015-05-16 ENCOUNTER — Encounter: Payer: Self-pay | Admitting: *Deleted

## 2015-05-16 DIAGNOSIS — I10 Essential (primary) hypertension: Secondary | ICD-10-CM

## 2015-05-16 DIAGNOSIS — Z952 Presence of prosthetic heart valve: Secondary | ICD-10-CM

## 2015-05-16 NOTE — Telephone Encounter (Signed)
I s/w pt about her echo results and explained that she will need to have a TEE. Bing Neighbors. PA also s/w pt and went over why and what this test is for. I then scheduled pt w/Dr. Delton See 05/21/15 @ 9 am for a TEE. Went over instructions by phone, also mailed.

## 2015-05-17 ENCOUNTER — Telehealth: Payer: Self-pay | Admitting: *Deleted

## 2015-05-17 ENCOUNTER — Other Ambulatory Visit (INDEPENDENT_AMBULATORY_CARE_PROVIDER_SITE_OTHER): Payer: BLUE CROSS/BLUE SHIELD

## 2015-05-17 DIAGNOSIS — I10 Essential (primary) hypertension: Secondary | ICD-10-CM

## 2015-05-17 LAB — BASIC METABOLIC PANEL
BUN: 9 mg/dL (ref 6–23)
CALCIUM: 9.3 mg/dL (ref 8.4–10.5)
CHLORIDE: 105 meq/L (ref 96–112)
CO2: 30 meq/L (ref 19–32)
CREATININE: 0.6 mg/dL (ref 0.40–1.20)
GFR: 121.15 mL/min (ref >60.00)
GLUCOSE: 87 mg/dL (ref 70–99)
Potassium: 4.3 mEq/L (ref 3.5–5.1)
Sodium: 141 mEq/L (ref 135–145)

## 2015-05-17 NOTE — Addendum Note (Signed)
Addended byAlben Spittle, Lorin Picket T on: 05/17/2015 10:09 AM   Modules accepted: Orders

## 2015-05-17 NOTE — Telephone Encounter (Signed)
Pt notified of lab results ok for TEE on 9/6.

## 2015-05-17 NOTE — Progress Notes (Addendum)
ADDENDUM: Echo 8/16:  Vigorous LVF, EF 65-70%, normal wall motion, paradoxical ventricular septal motion, mechanical AVR okay (mean 23 mmHg, peak 50 mmHg), trivial AI, mechanical MVR okay with trivial MR and normal gradients, mild LAE, normal RV function, mild TR, mild PI  Echocardiogram results reviewed with Dr. Herbie Baltimore. As her mechanical aortic valve gradients have increased since her prior echocardiogram, we have recommended proceeding with transesophageal echocardiogram to further evaluate.  I reviewed the procedure and risks and benefits with the patient over the phone. She agrees to proceed.  Signed,  Tereso Newcomer, PA-C   05/17/2015 10:02 AM

## 2015-05-21 ENCOUNTER — Ambulatory Visit (HOSPITAL_COMMUNITY)
Admission: RE | Admit: 2015-05-21 | Discharge: 2015-05-21 | Disposition: A | Discharge status: Home / Self Care | Payer: BLUE CROSS/BLUE SHIELD | Source: Ambulatory Visit | Attending: Cardiology | Admitting: Cardiology

## 2015-05-21 ENCOUNTER — Encounter (HOSPITAL_COMMUNITY): Payer: Self-pay

## 2015-05-21 ENCOUNTER — Encounter (HOSPITAL_COMMUNITY)
Admission: RE | Disposition: A | Discharge status: Home / Self Care | Payer: Self-pay | Source: Ambulatory Visit | Attending: Cardiology

## 2015-05-21 ENCOUNTER — Ambulatory Visit (HOSPITAL_BASED_OUTPATIENT_CLINIC_OR_DEPARTMENT_OTHER)
Admission: RE | Admit: 2015-05-21 | Discharge: 2015-05-21 | Disposition: A | Discharge status: Home / Self Care | Payer: BLUE CROSS/BLUE SHIELD | Source: Ambulatory Visit | Attending: Physician Assistant | Admitting: Physician Assistant

## 2015-05-21 DIAGNOSIS — I099 Rheumatic heart disease, unspecified: Secondary | ICD-10-CM

## 2015-05-21 DIAGNOSIS — I351 Nonrheumatic aortic (valve) insufficiency: Secondary | ICD-10-CM | POA: Diagnosis not present

## 2015-05-21 DIAGNOSIS — I080 Rheumatic disorders of both mitral and aortic valves: Secondary | ICD-10-CM | POA: Insufficient documentation

## 2015-05-21 DIAGNOSIS — Z952 Presence of prosthetic heart valve: Secondary | ICD-10-CM

## 2015-05-21 HISTORY — PX: TEE WITHOUT CARDIOVERSION: SHX5443

## 2015-05-21 SURGERY — ECHOCARDIOGRAM, TRANSESOPHAGEAL
Anesthesia: Moderate Sedation

## 2015-05-21 MED ORDER — MIDAZOLAM HCL 10 MG/2ML IJ SOLN
INTRAMUSCULAR | Status: DC | PRN
  Administered 2015-05-21: 1 mg via INTRAVENOUS
  Administered 2015-05-21: 2 mg via INTRAVENOUS
  Administered 2015-05-21 (×2): 1 mg via INTRAVENOUS

## 2015-05-21 MED ORDER — FENTANYL CITRATE (PF) 100 MCG/2ML IJ SOLN
INTRAMUSCULAR | Status: AC
  Filled 2015-05-21: qty 2

## 2015-05-21 MED ORDER — FENTANYL CITRATE (PF) 100 MCG/2ML IJ SOLN
INTRAMUSCULAR | Status: DC | PRN
  Administered 2015-05-21: 25 ug via INTRAVENOUS

## 2015-05-21 MED ORDER — MIDAZOLAM HCL 5 MG/ML IJ SOLN
INTRAMUSCULAR | Status: AC
  Filled 2015-05-21: qty 2

## 2015-05-21 MED ORDER — BUTAMBEN-TETRACAINE-BENZOCAINE 2-2-14 % EX AERO
INHALATION_SPRAY | CUTANEOUS | Status: DC | PRN
  Administered 2015-05-21: 2 via TOPICAL

## 2015-05-21 MED ORDER — SODIUM CHLORIDE 0.9 % IV SOLN
INTRAVENOUS | Status: DC
  Administered 2015-05-21: 08:00:00 via INTRAVENOUS

## 2015-05-21 MED ORDER — DIPHENHYDRAMINE HCL 50 MG/ML IJ SOLN
INTRAMUSCULAR | Status: AC
  Filled 2015-05-21: qty 1

## 2015-05-21 NOTE — Interval H&P Note (Signed)
History and Physical Interval Note:  05/21/2015 8:24 AM  [name]  has presented today for surgery, with the diagnosis of aortic valve replacement  The various methods of treatment have been discussed with the patient and family. After consideration of risks, benefits and other options for treatment, the patient has consented to  Procedure(s): TRANSESOPHAGEAL ECHOCARDIOGRAM (TEE) (N/A) as a surgical intervention .  The patient's history has been reviewed, patient examined, no change in status, stable for surgery.  I have reviewed the patient's chart and labs.  Questions were answered to the patient's satisfaction.     Lars Masson

## 2015-05-21 NOTE — H&P (View-Only) (Signed)
ADDENDUM: Echo 8/16:  Vigorous LVF, EF 65-70%, normal wall motion, paradoxical ventricular septal motion, mechanical AVR okay (mean 23 mmHg, peak 50 mmHg), trivial AI, mechanical MVR okay with trivial MR and normal gradients, mild LAE, normal RV function, mild TR, mild PI  Echocardiogram results reviewed with Dr. Harding. As her mechanical aortic valve gradients have increased since her prior echocardiogram, we have recommended proceeding with transesophageal echocardiogram to further evaluate.  I reviewed the procedure and risks and benefits with the patient over the phone. She agrees to proceed.  Signed,  Ruqaya Strauss, PA-C   05/17/2015 10:02 AM   

## 2015-05-21 NOTE — CV Procedure (Signed)
   Transesophageal Echocardiogram Note  [name] [id number] 12-01-1980  Procedure: Transesophageal Echocardiogram Indications: AVR, MVR  Procedure Details Consent: Obtained Time Out: Verified patient identification, verified procedure, site/side was marked, verified correct patient position, special equipment/implants available, Radiology Safety Procedures followed,  medications/allergies/relevent history reviewed, required imaging and test results available.  Performed  Medications: Fentanyl: 25 mcg Versed: 5 mg         Left ventricle: The cavity size was normal. Systolic function was vigorous. The estimated ejection fraction was in the range of 65% to 70%. Wall motion was normal; there were no regional wall motion abnormalities.  Aortic valve: A mechanical 21 mm Sorin valve sits well in the aortic position. The transaortic gradients were lower than measured on TTE, however the angle was suboptimal. Mild AI.   Mitral valve: A 33 mm mechanical mitral valve sits well in the mitral position. There is trivial mitral regurgitation and normal transmitral gradients.  Left atrium: The atrium was mildly dilated.  Right ventricle: The cavity size was normal. Wall thickness was normal. Systolic function was normal.  Tricuspid valve: There was mild regurgitation. Normal RVSP.  Aorta: No plaque.  Complications: No apparent complications Patient did tolerate procedure well.  Lars Masson, MD, [facility] 05/21/2015, 8:26 AM

## 2015-05-21 NOTE — Discharge Instructions (Signed)
Conscious Sedation °Sedation is the use of medicines to promote relaxation and relieve discomfort and anxiety. Conscious sedation is a type of sedation. Under conscious sedation you are less alert than normal but are still able to respond to instructions or stimulation. Conscious sedation is used during short medical and dental procedures. It is milder than deep sedation or general anesthesia and allows you to return to your regular activities sooner.  °LET YOUR HEALTH CARE PROVIDER KNOW ABOUT:  °· Any allergies you have. °· All medicines you are taking, including vitamins, herbs, eye drops, creams, and over-the-counter medicines. °· Use of steroids (by mouth or creams). °· Previous problems you or members of your family have had with the use of anesthetics. °· Any blood disorders you have. °· Previous surgeries you have had. °· Medical conditions you have. °· Possibility of pregnancy, if this applies. °· Use of cigarettes, alcohol, or illegal drugs. °RISKS AND COMPLICATIONS °Generally, this is a safe procedure. However, as with any procedure, problems can occur. Possible problems include: °· Oversedation. °· Trouble breathing on your own. You may need to have a breathing tube until you are awake and breathing on your own. °· Allergic reaction to any of the medicines used for the procedure. °BEFORE THE PROCEDURE °· You may have blood tests done. These tests can help show how well your kidneys and liver are working. They can also show how well your blood clots. °· A physical exam will be done.   °· Only take medicines as directed by your health care provider. You may need to stop taking medicines (such as blood thinners, aspirin, or nonsteroidal anti-inflammatory drugs) before the procedure.   °· Do not eat or drink at least 6 hours before the procedure or as directed by your health care provider. °· Arrange for a responsible adult, family member, or friend to take you home after the procedure. He or she should stay  with you for at least 24 hours after the procedure, until the medicine has worn off. °PROCEDURE  °· An intravenous (IV) catheter will be inserted into one of your veins. Medicine will be able to flow directly into your body through this catheter. You may be given medicine through this tube to help prevent pain and help you relax. °· The medical or dental procedure will be done. °AFTER THE PROCEDURE °· You will stay in a recovery area until the medicine has worn off. Your blood pressure and pulse will be checked.   °·  Depending on the procedure you had, you may be allowed to go home when you can tolerate liquids and your pain is under control. °Document Released: 05/26/2001 Document Revised: 09/05/2013 Document Reviewed: 05/08/2013 °ExitCare® Patient Information ©2015 [employer]. This information is not intended to replace advice given to you by your health care provider. Make sure you discuss any questions you have with your health care provider. °Transesophageal Echocardiogram °Transesophageal echocardiography (TEE) is a special type of test that produces images of the heart by using sound waves (echocardiogram). This type of echocardiography can obtain better images of the heart than standard echocardiography. TEE is done by passing a flexible tube down the esophagus. The heart is located in front of the esophagus. Because the heart and esophagus are close to one another, your health care provider can take very clear, detailed pictures of the heart via ultrasound waves. °TEE may be done: °· If your health care provider needs more information based on standard echocardiography findings. °· If you had a stroke. This might have   happened because a clot formed in your heart. TEE can visualize different areas of the heart and check for clots. °· To check valve anatomy and function. °· To check for infection on the inside of your heart (endocarditis). °· To evaluate the dividing wall (septum) of the heart and  presence of a hole that did not close after birth (patent foramen ovale or atrial septal defect). °· To help diagnose a tear in the wall of the aorta (aortic dissection). °· During cardiac valve surgery. This allows the surgeon to assess the valve repair before closing the chest. °· During a variety of other cardiac procedures to guide positioning of catheters. °· Sometimes before a cardioversion, which is a shock to convert heart rhythm back to normal. °LET YOUR HEALTH CARE PROVIDER KNOW ABOUT:  °· Any allergies you have. °· All medicines you are taking, including vitamins, herbs, eye drops, creams, and over-the-counter medicines. °· Previous problems you or members of your family have had with the use of anesthetics. °· Any blood disorders you have. °· Previous surgeries you have had. °· Medical conditions you have. °· Swallowing difficulties. °· An esophageal obstruction. °RISKS AND COMPLICATIONS  °Generally, TEE is a safe procedure. However, as with any procedure, complications can occur. Possible complications include an esophageal tear (rupture). °BEFORE THE PROCEDURE  °· Do not eat or drink for 6 hours before the procedure or as directed by your health care provider. °· Arrange for someone to drive you home after the procedure. Do not drive yourself home. During the procedure, you will be given medicines that can continue to make you feel drowsy and can impair your reflexes. °· An IV access tube will be started in the arm. °PROCEDURE  °· A medicine to help you relax (sedative) will be given through the IV access tube. °· A medicine may be sprayed or gargled to numb the back of the throat. °· Your blood pressure, heart rate, and breathing (vital signs) will be monitored during the procedure. °· The TEE probe is a long, flexible tube. The tip of the probe is placed into the back of the mouth, and you will be asked to swallow. This helps to pass the tip of the probe into the esophagus. Once the tip of the probe  is in the correct area, your health care provider can take pictures of the heart. °· TEE is usually not a painful procedure. You may feel the probe press against the back of the throat. The probe does not enter the trachea and does not affect your breathing. °AFTER THE PROCEDURE  °· You will be in bed, resting, until you have fully returned to consciousness. °· When you first awaken, your throat may feel slightly sore and will probably still feel numb. This will improve slowly over time. °· You will not be allowed to eat or drink until it is clear that the numbness has improved. °· Once you have been able to drink, urinate, and sit on the edge of the bed without feeling sick to your stomach (nausea) or dizzy, you may be cleared to go home. °· You should have a friend or family member with you for the next 24 hours after your procedure. °Document Released: 11/21/2002 Document Revised: 09/05/2013 Document Reviewed: 03/02/2013 °ExitCare® Patient Information ©2015 [employer]. This information is not intended to replace advice given to you by your health care provider. Make sure you discuss any questions you have with your health care provider. ° °

## 2015-05-22 ENCOUNTER — Telehealth: Payer: Self-pay | Admitting: *Deleted

## 2015-05-22 ENCOUNTER — Encounter (HOSPITAL_COMMUNITY): Payer: Self-pay | Admitting: Cardiology

## 2015-05-22 NOTE — Telephone Encounter (Signed)
Pt notified of TEE findings per Bing Neighbors. PA that MVR and AVR stable and that Dr. Herbie Baltimore will discuss further at [facility] on 9/12. Pt verbalized understanding to plan of care.

## 2015-05-27 ENCOUNTER — Encounter: Payer: Self-pay | Admitting: Cardiology

## 2015-05-27 ENCOUNTER — Ambulatory Visit (INDEPENDENT_AMBULATORY_CARE_PROVIDER_SITE_OTHER): Payer: BLUE CROSS/BLUE SHIELD | Admitting: Cardiology

## 2015-05-27 VITALS — BP 126/82 | HR 73 | Ht 63.0 in | Wt 144.2 lb

## 2015-05-27 DIAGNOSIS — Z7901 Long term (current) use of anticoagulants: Secondary | ICD-10-CM

## 2015-05-27 DIAGNOSIS — Z954 Presence of other heart-valve replacement: Secondary | ICD-10-CM | POA: Diagnosis not present

## 2015-05-27 DIAGNOSIS — I10 Essential (primary) hypertension: Secondary | ICD-10-CM | POA: Diagnosis not present

## 2015-05-27 DIAGNOSIS — Z952 Presence of prosthetic heart valve: Secondary | ICD-10-CM

## 2015-05-27 NOTE — Assessment & Plan Note (Signed)
I last saw her, I probably would do an echo every couple years, however with her having a chest episode this year she did have an echocardiogram done. This showed increased callosities across the aortic valve comparison to 05/03/2013. She subsequently had TEE done which showed that the valve leaflets are opening okay. She actually is not having any symptoms to suggest aortic stenosis that is significant. Based on recommendations we will followup with a CT scan next year prior to a dental followup. If her radius to increase, or she becomes symptomatic, would consider cardiac CT to evaluate for possible pannus incursion on the mechanical leaflets. I think if possible the gradients are missing due to to the angle of her valve and the aorta.

## 2015-05-27 NOTE — Assessment & Plan Note (Signed)
Doing well her femorals at home with assistance from our pharmacy team. No bleeding complications. Maintaining within target INR 2.5 - 35.

## 2015-05-27 NOTE — Progress Notes (Signed)
PCP: [name] Patient  Clinic Note: Chief Complaint  Patient presents with  . Annual Exam    c/o no pain  . Cardiac Valve Problem    s/p MVR, AVR for Rhematic Heart Disease    HPI: [name] is a 34 y.o. female with a hx of rheumatic heart disease s/p mechanical AVR + mechanical MVR in 2010.  Annual exam - & TEE f/u.  BLAKE GOYA was last seen by me in 04/2014 Seen by Mr. Tereso Newcomer, [place] 09/6107 for CP -- ordered 2 D Echo for f/u - increased gradient noted -- TEE done. Chest pain was likely musculoskeletal as it has not recurred.  Recent Hospitalizations: n/a  Studies Reviewed:   2D Echo  (05/14/2015)  - Left ventricle: The cavity size was normal. Vigorous systolic function.  EF 65-70%.  No RWMA .  - Ventricular septum: Septal motion showed paradox.  - Aortic valve: A mechanical 21 mm Sorin valve sits well in the aortic position. The transaortic gradients are elevated with mean 23 mmHg, peak 50 mmHg. There was trivial regurgitation.  - Aortic root: The aortic root was normal in size. - Mitral valve: A 33 mm mechanical mitral valve sits well in the mitral position. There is trivial mitral regurgitation and normal transmitral gradients.  Impressions:  - Compared to the prior study from 04/24/2013 the transaortic gradients across the aortic valve are elevated now peak 50 mmHg, mean 23 mmHg, previously peak 33 and mean 17 mmHg. Consider a TEE for further evaluation.   TEE (05/21/2015): - Left ventricle: The cavity size was normal. There was mild concentric LVH, Viigorous systolic function.  EF 65-70%. No RWMA.Marland Kitchen  - Aortic valve: 21 mm mechanical aortic valve sits well in the aortic position. Both leaflets are opening well. Transaortic gradients are peak/mean 31/15 mmHg with the suboptimal angle. There is mild aortic regurgitation. - Aortic root: The aortic root was not dilated. - Ascending aorta: The ascending aorta was normal in size. - Mitral valve: A  33 mm mechanical valve sits well in the mitral position. Leaflets are well visualized and opening well. There are normal transmitral gradients. Mild mitral regurgitation. - Left atrium: The atrium was dilated. No evidence of thrombus in the atrial cavity or appendage. No evidence of thrombus in the atrial cavity or appendage.  Impressions:  - Both aortic valve leaflets are opening well. Transaortic gradients are elevated on TTE (suboptimal angle on TEE). Normal RVSP. No additional imaging needed at this point as the patient is asymptomatic. Repeat TTE in 1 year. If the patient becomes symptomatic or gradients continue to worsen a cardiac CT would be recommended.  Interval History: After the one episode of CP - seen by Mr. Alben Spittle - [facility].  No further episodes of CP.  Otherwise, she has not had any chest tightness or pressure symptoms with rest or exertion. No heart failure symptoms of PND, orthopnea or edema. She says that she really pushes it with exercise she will get somewhat short of breath, but with her routine exercise and activity level she is not having any symptoms. She denies any rapid irregular heartbeat/palpitations to suggest arrhythmia or significant PVCs/PACs. No lightheadedness, dizziness or syncope/near syncope. No TIA/amaurosis fugax.  She is doing a good job maintaining an INR somewhere between 2.5 3.5 (usually between 3-3.5) without any significant bleeding complications of melena hematochezia, hematuria or epistaxis. No significant easy bruising.  Past Medical History  Diagnosis Date  . Rheumatic valvular disease  July 2010; 04/2013    Status post AVR (21 mm Sorin [employer] mechanical) and MVR (32 mm Sorin [employer] Opteform mechanical) for Aortic and mitral valve stenosis with regurgitation;; b) F/u Echo 04/2013: Normal LV size and thickness. EF 60-65%. 21 mm mechanical prosthetic AoV & 33 mm mechanical MV present and functioning normally.  trivial  regurgitation. No change since 2013  . Seasonal allergies   . Borderline hypertension   . Postoperative atrial fibrillation     No recurrence  . History of stress test     ETT-echo 8/16: Normal  . History of echocardiogram     Echo 8/16: Vigorous LVF, EF 65-70%, normal wall motion, paradoxical ventricular septal motion, mechanical AVR okay (mean 23 mmHg, peak 50 mmHg), trivial AI, mechanical MVR okay with trivial MR and normal gradients, mild LAE, normal RV function, mild TR, mild PI  . History of transesophageal echocardiography (TEE) for monitoring     TEE 9/16: Mild LVH, vigorous LVF, EF 65-70%, normal wall motion, mechanical AVR with peak/mean gradients 31/15 mmHg with suboptimal angle, mild AI, mechanical MVR with normal transmitral gradients, mild MR, no LAA clot, no arm or a a clot    Past Surgical History  Procedure Laterality Date  . Avr and mvr  July 2010    Dr. Barry Dienes: 21 mm Sorin [employer]ics mechanical aortic valve ; 33 mm Sorin [employer]omedics Optiform mechanical prosthetic mitral valve  . Transthoracic echocardiogram  May 2013    EF 50-55% with no regional wall motion abnormalities;Well-seated aortic valve with normal gradients (peak 41, mean 23 mmHg); well-seated mitral prosthetic valve no regurgitation normal gradients; mild concentric LVH with impaired relaxation and moderate dilated left atrium.    Marland Kitchen [employer]  May 2013    No ischemia or infarction  . Cardiac catheterization  June 2010    Normal coronary arteries  . Tee without cardioversion N/A 05/21/2015    Procedure: TRANSESOPHAGEAL ECHOCARDIOGRAM (TEE);  Surgeon: Lars Masson, MD;  Location: [facility] ENDOSCOPY;  Service: Cardiovascular;  Laterality: N/A;   ROS: A comprehensive was performed. Review of Systems  Constitutional: Positive for malaise/fatigue.  Respiratory: Negative.   Cardiovascular:       Per history of present illness  Gastrointestinal: Negative.   Genitourinary: Negative.   Musculoskeletal:  Negative for myalgias, joint pain and falls.  Neurological: Negative for dizziness.  All other systems reviewed and are negative.   Prior to Admission medications   Medication Sig Start Date End Date Taking? Authorizing Provider  aspirin EC 81 MG tablet Take 1 tablet (81 mg total) by mouth daily. 04/29/15  Yes Beatrice Lecher, PA-C  warfarin (COUMADIN) 7.5 MG tablet Take 1 to 1 & 1/2 tablets by mouth daily as directed 05/14/14  Yes Marykay Lex, MD   Allergies  Allergen Reactions  . Ace Inhibitors Cough  . Oxycodone Other (See Comments)    hallucinate     Social History   Social History  . Marital Status: Married    Spouse Name: N/A  . Number of Children: N/A  . Years of Education: N/A   Social History Main Topics  . Smoking status: Never Smoker   . Smokeless tobacco: None  . Alcohol Use: No  . Drug Use: No  . Sexual Activity: Not Asked   Other Topics Concern  . None   Social History Narrative   She is a married mother of 2 who exercises regularly for 30 minutes every other day without difficulty.  Has not taken  does not smoke.      Her initial postop course from bowel surgery was complicated by mediastinitis and then a large keloid scar along the midline incision.  She also had postop A. fib that resolved.  She says he finally starting to get "back to herself this past year "    Family History  Problem Relation Age of Onset  . Heart disease Mother   . Hyperlipidemia Mother   . Hyperlipidemia Father      Wt Readings from Last 3 Encounters:  05/27/15 144 lb 3.2 oz (65.409 kg)  04/29/15 144 lb 9.6 oz (65.59 kg)  05/14/14 141 lb 1.9 oz (64.012 kg)    PHYSICAL EXAM BP 126/82 mmHg  Pulse 73  Ht  (1.6 m)  Wt 144 lb 3.2 oz (65.409 kg)  BMI 25.55 kg/m2  LMP 05/17/2015 General appearance: alert, cooperative, appears stated age, no distress and healthy appearing. Neck: no adenopathy, no carotid bruit and no JVD - radiated AoV murmu Lungs: clear to  auscultation bilaterally, normal percussion bilaterally and non-labored Heart: regular rate and rhythm, S1, S2 crisp metallic, 2-3/6 SEM @ base--> carotids; No click, rub or gallop; non-displaced PMI Abdomen: soft, non-tender; bowel sounds normal; no masses,  no organomegaly;No HJR Extremities: extremities normal, atraumatic, no cyanosis, oredema Pulses: 2+ and symmetric;  Skin: normal or stable keloid sternotomy scar Neurologic: Mental status: Alert, oriented, thought content appropriate Cranial nerves: normal (II-XII grossly intact)    Adult ECG Report  Rate: 73 ;  Rhythm: normal sinus rhythm and PACs, RSR' in V1&2 ? RV conduction delay, Non-specific ST-T abnormality with TWI in Inferior Leads, ;   Narrative Interpretation: Stable EKG   Other studies Reviewed: Additional studies/ records that were reviewed today include:  Recent Labs:      Chemistry      Component Value Date/Time   NA 141 05/17/2015 0902   K 4.3 05/17/2015 0902   CL 105 05/17/2015 0902   CO2 30 05/17/2015 0902   BUN 9 05/17/2015 0902   CREATININE 0.60 05/17/2015 0902      Component Value Date/Time   CALCIUM 9.3 05/17/2015 0902   ALKPHOS 52 03/22/2009 1039   AST 15 03/22/2009 1039   ALT 13 03/22/2009 1039   BILITOT 1.0 03/22/2009 1039       ASSESSMENT / PLAN: Problem List Items Addressed This Visit    Essential hypertension (Chronic)   Relevant Orders   EKG 12-Lead   ECHOCARDIOGRAM COMPLETE   H/O aortic valve replacement - Primary (Chronic)    I last saw her, I probably would do an echo every couple years, however with her having a chest episode this year she did have an echocardiogram done. This showed increased callosities across the aortic valve comparison to 05/03/2013. She subsequently had TEE done which showed that the valve leaflets are opening okay. She actually is not having any symptoms to suggest aortic stenosis that is significant. Based on recommendations we will followup with a CT scan  next year prior to a dental followup. If her radius to increase, or she becomes symptomatic, would consider cardiac CT to evaluate for possible pannus incursion on the mechanical leaflets. I think if possible the gradients are missing due to to the angle of her valve and the aorta.      Relevant Orders   EKG 12-Lead   ECHOCARDIOGRAM COMPLETE   H/O mitral valve replacement (Chronic)    Thankfully, the mitral valve looked pretty normal. This will be also followed  with her aortic valve. The aortic valve she does use prophylactic antibiotics.      Relevant Orders   EKG 12-Lead   ECHOCARDIOGRAM COMPLETE   Long term current use of anticoagulant therapy (Chronic)    Doing well her femorals at home with assistance from our pharmacy team. No bleeding complications. Maintaining within target INR 2.5 - 35.      Relevant Orders   EKG 12-Lead   ECHOCARDIOGRAM COMPLETE      Current medicines are reviewed at length with the patient today. (+/- concerns) n/a The following changes have been made: n/a Studies Ordered:   Orders Placed This Encounter  Procedures  . EKG 12-Lead  . ECHOCARDIOGRAM COMPLETE    Will followup in August 2017, following echocardiogram.  Marykay Lex, M.D., M.S. Interventional Cardiologist   Pager # [phone]

## 2015-05-27 NOTE — Patient Instructions (Signed)
.  Your physician has requested that you have an echocardiogram AUG 2017. Echocardiography is a painless test that uses sound waves to create images of your heart. It provides your doctor with information about the size and shape of your heart and how well your heart's chambers and valves are working. This procedure takes approximately one hour. There are no restrictions for this procedure.  NO CHANGE WITH CURRENT MEDICATIONS.    Your physician wants you to follow-up in AUG 2017 WITH DR HARDING-AFTER ECHO IS COMPLETED.  You will receive a reminder letter in the mail two months in advance. If you don't receive a letter, please call our office to schedule the follow-up appointment.

## 2015-05-27 NOTE — Assessment & Plan Note (Signed)
Thankfully, the mitral valve looked pretty normal. This will be also followed with her aortic valve. The aortic valve she does use prophylactic antibiotics.

## 2015-05-28 ENCOUNTER — Telehealth: Payer: Self-pay | Admitting: Cardiology

## 2015-05-28 DIAGNOSIS — Z83438 Family history of other disorder of lipoprotein metabolism and other lipidemia: Secondary | ICD-10-CM

## 2015-05-28 NOTE — Telephone Encounter (Signed)
OK - we can order.  [name], Katelyn W

## 2015-05-28 NOTE — Telephone Encounter (Signed)
Pt requesting cholesterol test.

## 2015-05-28 NOTE — Telephone Encounter (Signed)
Pt says she would like to have her Cholesterol checked.She forgot to ask yesterday.

## 2015-05-30 NOTE — Telephone Encounter (Signed)
Called patient, informed OK to proceed w/ lipid test.  Advised on location for lab draw and directions for fasting, she voiced understanding.

## 2015-06-04 LAB — LIPID PANEL
CHOLESTEROL: 190 mg/dL (ref 125–200)
HDL: 60 mg/dL (ref >=46)
LDL CALC: 113 mg/dL (ref <130)
TRIGLYCERIDES: 86 mg/dL (ref <150)
Total CHOL/HDL Ratio: 3.2 Ratio (ref <=5.0)
VLDL: 17 mg/dL (ref <30)

## 2015-06-07 ENCOUNTER — Telehealth: Payer: Self-pay | Admitting: Cardiology

## 2015-06-07 NOTE — Telephone Encounter (Signed)
Pt informed cholesterol results not released yet.  She is wanting to get tubal ligation. Recommended she contact OBGYN and have them submit request for clearance. History (aortic & mitral valve replacement) as noted in chart. On long term anticoagulant therapy (coumadin) Pt informed I would send this note to Dr. Herbie Baltimore to make him aware of her intent for surgery, will get his advice on considerations for this.

## 2015-06-07 NOTE — Telephone Encounter (Signed)
I will review her lipids this week and report on those. As for her being on going to surgery. I will see any medical reason for her not to do the surgery. While we would need to do is coordinated with Phillips Hay & her OB te determine the most appropriate way to bridge her while she holds warfarin -- as far as I know, we don't yet have data using Lovenox injections for her mechanical mitral valve patients.  I will ask Belenda Cruise to help out with this -- she may need to stay in the hospital for couple days preop and postop.  Marykay Lex, MD

## 2015-06-07 NOTE — Telephone Encounter (Signed)
Pt called in wanting to get the results to her cholesterol levels that was taken on Monday and she would like to know if Dr. Herbie Baltimore approves of her having a tubal ligation with her heart condition. Please f/u with her   Thanks

## 2015-06-10 ENCOUNTER — Telehealth: Payer: Self-pay | Admitting: *Deleted

## 2015-06-10 NOTE — Telephone Encounter (Signed)
-----   Message from Marykay Lex, MD sent at 06/10/2015  6:13 PM EDT ----- Overall, lipids look pretty good. Total cholesterol 190 which for someone your age is a relatively safe number. The good cholesterol (HDL) is well above goal, and the LDL is well within target range for you at 113.  Marykay Lex, MD

## 2015-06-10 NOTE — Telephone Encounter (Signed)
Spoke to patient. Result given . Verbalized understanding  

## 2015-06-13 ENCOUNTER — Telehealth: Payer: Self-pay | Admitting: Cardiology

## 2015-06-13 NOTE — Telephone Encounter (Signed)
Because of mechanical valves, would just have to lovenox bridge her for any procedures.  This would be easy to do.  The biggest risk  would be perforation with insertion which happens < 0.1% and occurs at insertion or within a few days.  The risk of vaginal infections is listed at about 13%.  (data from [employer] and relates to hormone laced IUD's not copper).   Want me to call Dr. Dion Body regarding lovenox bridging?  I would think he/she would be better able to talk to [place] about the infection risks.

## 2015-06-13 NOTE — Telephone Encounter (Signed)
Wow - not sure about that.  In-dwelling devices are always more risky than not having one.   Not sure what the data on potential infection risk with IUD is.   Will tyr to figure it out -- Gyn may have IUD data.    DH

## 2015-06-13 NOTE — Telephone Encounter (Signed)
Patient wants to get tubes tide on coumadin needs adivce  Dr. Dion Body OB GYN called.  Needs advice on your mutual patient.  Patient wants to get tubes tied, on coumadin and Dr. Dion Body needs guidance on stopping and restarting. [phone])  She has had an IUD before...is this a safe alternative to tubal?  Would she be a higher risk for infection with the IUD in place?  Routing to [employer], pharmacist and Dr. Herbie Baltimore

## 2015-06-13 NOTE — Telephone Encounter (Signed)
Would defer to Krisitn.  Would need bridging for surgery while off of warfarin.  Marykay Lex, MD

## 2015-06-14 NOTE — Telephone Encounter (Signed)
Belenda Cruise, I am sure Dr. Dion Body would appreciate your guidance with Lovenox bridging.  Feel free to contact her.

## 2015-06-14 NOTE — Telephone Encounter (Signed)
Left message with RN explaining lovenox bridge - she will pass information on to MD

## 2015-06-14 NOTE — Telephone Encounter (Signed)
Thanks for handling this.  DH

## 2015-07-09 ENCOUNTER — Other Ambulatory Visit: Payer: Self-pay | Admitting: Cardiology

## 2015-07-15 ENCOUNTER — Other Ambulatory Visit: Payer: Self-pay | Admitting: Cardiology

## 2015-07-17 NOTE — Telephone Encounter (Signed)
Spoke to patient  She states she will have her teeth clean. RN informed patient  Prescription sent to pharmacy.

## 2015-08-05 ENCOUNTER — Ambulatory Visit: Admit: 2015-08-05 | Payer: BLUE CROSS/BLUE SHIELD | Admitting: Obstetrics and Gynecology

## 2015-08-05 SURGERY — SALPINGECTOMY, BILATERAL, LAPAROSCOPIC
Anesthesia: Choice | Laterality: Bilateral

## 2016-03-31 ENCOUNTER — Other Ambulatory Visit: Payer: Self-pay | Admitting: Cardiology

## 2016-03-31 NOTE — Telephone Encounter (Signed)
Routed refill

## 2016-04-02 ENCOUNTER — Telehealth: Payer: Self-pay | Admitting: Pharmacist Clinician (PhC)/ Clinical Pharmacy Specialist

## 2016-04-02 NOTE — Telephone Encounter (Signed)
Patient called to report her INR has been stable for the past several months.  Checks on the first of each month and has not had any variances from 2.6-3.   Prescription refilled for another 6 months

## 2016-05-11 ENCOUNTER — Other Ambulatory Visit (HOSPITAL_COMMUNITY)
Admission: RE | Admit: 2016-05-11 | Discharge: 2016-05-11 | Disposition: A | Discharge status: Home / Self Care | Payer: 59 | Source: Ambulatory Visit | Attending: Obstetrics and Gynecology | Admitting: Obstetrics and Gynecology

## 2016-05-11 ENCOUNTER — Other Ambulatory Visit: Payer: Self-pay | Admitting: Obstetrics and Gynecology

## 2016-05-11 DIAGNOSIS — Z1151 Encounter for screening for human papillomavirus (HPV): Secondary | ICD-10-CM | POA: Diagnosis not present

## 2016-05-11 DIAGNOSIS — Z01419 Encounter for gynecological examination (general) (routine) without abnormal findings: Secondary | ICD-10-CM | POA: Diagnosis present

## 2016-05-12 ENCOUNTER — Encounter (INDEPENDENT_AMBULATORY_CARE_PROVIDER_SITE_OTHER): Payer: Self-pay

## 2016-05-12 ENCOUNTER — Ambulatory Visit (HOSPITAL_COMMUNITY): Payer: 59 | Attending: Cardiology

## 2016-05-12 ENCOUNTER — Other Ambulatory Visit: Payer: Self-pay

## 2016-05-12 DIAGNOSIS — Z952 Presence of prosthetic heart valve: Secondary | ICD-10-CM | POA: Diagnosis not present

## 2016-05-12 DIAGNOSIS — I351 Nonrheumatic aortic (valve) insufficiency: Secondary | ICD-10-CM | POA: Diagnosis not present

## 2016-05-12 DIAGNOSIS — I071 Rheumatic tricuspid insufficiency: Secondary | ICD-10-CM | POA: Diagnosis not present

## 2016-05-12 DIAGNOSIS — I059 Rheumatic mitral valve disease, unspecified: Secondary | ICD-10-CM | POA: Diagnosis present

## 2016-05-12 DIAGNOSIS — I10 Essential (primary) hypertension: Secondary | ICD-10-CM | POA: Diagnosis not present

## 2016-05-12 DIAGNOSIS — Z954 Presence of other heart-valve replacement: Secondary | ICD-10-CM

## 2016-05-12 DIAGNOSIS — Z7901 Long term (current) use of anticoagulants: Secondary | ICD-10-CM

## 2016-05-12 DIAGNOSIS — I119 Hypertensive heart disease without heart failure: Secondary | ICD-10-CM | POA: Diagnosis not present

## 2016-05-12 LAB — CYTOLOGY - PAP

## 2016-05-13 NOTE — Progress Notes (Signed)
Echo results: Good news: Essentially normal echocardiogram and normal pump function and  No regional wall motion abnormalities.  EF: 55-60%.  No signs to suggest heart attack. Prosthetic aortic valve looks well seated and appears to be functioning well. No change in the minimal valve gradient that was seen. Not unexpected as the valve is slightly smaller than the original valve.  Overall good news. Stable echo.  Bryan Lemmaavid Leasia Swann, MD

## 2016-05-15 ENCOUNTER — Telehealth: Payer: Self-pay | Admitting: *Deleted

## 2016-05-15 MED ORDER — WARFARIN SODIUM 7.5 MG PO TABS: ORAL_TABLET | ORAL | 2 refills | Status: DC

## 2016-05-15 NOTE — Telephone Encounter (Signed)
Spoke to patient. Echo Result given to patient .schedule office appointment 06/2016. Patient would like to knowif appointment can wait until next year. Reviewed WITH DR HARDING . OKAY TO RESCHEDULE FOR August 2018.  Cancelled OCT 2017 , PLACE FOR RECALL APPOINTMENT 2018. PATIENT AWARE. PRESCRIPTION  E-SENT FOR WARFARIN.

## 2016-06-14 HISTORY — PX: TRANSTHORACIC ECHOCARDIOGRAM: SHX275

## 2016-06-30 ENCOUNTER — Ambulatory Visit: Payer: 59 | Admitting: Cardiology

## 2016-09-11 ENCOUNTER — Encounter (HOSPITAL_BASED_OUTPATIENT_CLINIC_OR_DEPARTMENT_OTHER): Payer: Self-pay | Admitting: Respiratory Therapy

## 2016-09-11 ENCOUNTER — Emergency Department (HOSPITAL_BASED_OUTPATIENT_CLINIC_OR_DEPARTMENT_OTHER)
Admission: EM | Admit: 2016-09-11 | Discharge: 2016-09-12 | Disposition: A | Discharge status: Home / Self Care | Payer: 59 | Attending: Emergency Medicine | Admitting: Emergency Medicine

## 2016-09-11 DIAGNOSIS — Z952 Presence of prosthetic heart valve: Secondary | ICD-10-CM | POA: Diagnosis not present

## 2016-09-11 DIAGNOSIS — Z7901 Long term (current) use of anticoagulants: Secondary | ICD-10-CM | POA: Insufficient documentation

## 2016-09-11 DIAGNOSIS — I10 Essential (primary) hypertension: Secondary | ICD-10-CM | POA: Insufficient documentation

## 2016-09-11 DIAGNOSIS — L309 Dermatitis, unspecified: Secondary | ICD-10-CM | POA: Diagnosis not present

## 2016-09-11 DIAGNOSIS — Z7982 Long term (current) use of aspirin: Secondary | ICD-10-CM | POA: Diagnosis not present

## 2016-09-11 DIAGNOSIS — R21 Rash and other nonspecific skin eruption: Secondary | ICD-10-CM

## 2016-09-11 NOTE — ED Triage Notes (Signed)
Pt with facial swelling and hives to face and chest. Denies new medications soaps lotions or foods denies SHOB or wheezing difficulty breathing

## 2016-09-12 MED ORDER — FAMOTIDINE 20 MG PO TABS
20.0000 mg | ORAL_TABLET | Freq: Once | ORAL | Status: AC
  Administered 2016-09-12: 20 mg via ORAL
  Filled 2016-09-12: qty 1

## 2016-09-12 MED ORDER — PREDNISONE 20 MG PO TABS
ORAL_TABLET | ORAL | 0 refills | Status: DC
Start: 2016-09-12 — End: 2017-05-10

## 2016-09-12 MED ORDER — FAMOTIDINE 20 MG PO TABS: 20.0000 mg | ORAL_TABLET | Freq: Two times a day (BID) | ORAL | 0 refills | Status: DC

## 2016-09-12 MED ORDER — DIPHENHYDRAMINE HCL 25 MG PO CAPS: 25.0000 mg | ORAL_CAPSULE | Freq: Once | ORAL | Status: DC

## 2016-09-12 MED ORDER — PREDNISONE 10 MG PO TABS
60.0000 mg | ORAL_TABLET | Freq: Once | ORAL | Status: AC
  Administered 2016-09-12: 60 mg via ORAL
  Filled 2016-09-12: qty 1

## 2016-09-12 MED ORDER — DIPHENHYDRAMINE HCL 25 MG PO CAPS: 25.0000 mg | ORAL_CAPSULE | Freq: Four times a day (QID) | ORAL | 0 refills | Status: DC | PRN

## 2016-09-12 NOTE — ED Provider Notes (Signed)
[facility]   CSN: [id number] Arrival date & time: 09/11/16  2351     History   Chief Complaint Chief Complaint  Patient presents with  . Urticaria    HPI [name] is a 35 y.o. female.  Patient with history of valve replacement on Coumadin -- presents with complaint of rash to bilateral cheeks and upper neck starting 2 days ago. [place] She's been using her typical soaps and lotions. She does not know of any obvious contacts to the area, however patient was at a restaurant where there was heat from an open flame. Symptoms started shortly after this exposure. Area is slightly itchy. She applied cortisone cream without much relief. She took Benadryl just prior to arrival after driving home from vacation. She denies any lip swelling, tongue swelling, difficulty breathing or swallowing. She denies vomiting or diarrhea. She denies lightheadedness or syncope. No history of anaphylaxis or severe allergic reactions. The onset of this condition was acute. The course is constant. Aggravating factors: none. Alleviating factors: none.        Past Medical History:  Diagnosis Date  . Borderline hypertension   . History of echocardiogram    Echo 8/16: Vigorous LVF, EF 65-70%, normal wall motion, paradoxical ventricular septal motion, mechanical AVR okay (mean 23 mmHg, peak 50 mmHg), trivial AI, mechanical MVR okay with trivial MR and normal gradients, mild LAE, normal RV function, mild TR, mild PI  . History of stress test    ETT-echo 8/16: Normal  . History of transesophageal echocardiography (TEE) for monitoring    TEE 9/16: Mild LVH, vigorous LVF, EF 65-70%, normal wall motion, mechanical AVR with peak/mean gradients 31/15 mmHg with suboptimal angle, mild AI, mechanical MVR with normal transmitral gradients, mild MR, no LAA clot, no arm or a a clot  . Postoperative atrial fibrillation (HCC)    No recurrence  . Rheumatic  valvular disease July 2010; 04/2013   Status post AVR (21 mm Sorin CarboMedics mechanical) and MVR (32 mm Sorin Carbometrics Opteform mechanical) for Aortic and mitral valve stenosis with regurgitation;; b) F/u Echo 04/2013: Normal LV size and thickness. EF 60-65%. 21 mm mechanical prosthetic AoV & 33 mm mechanical MV present and functioning normally.  trivial regurgitation. No change since 2013  . Seasonal allergies     Patient Active Problem List   Diagnosis Date Noted  . Essential hypertension 04/03/2013    Class: History of  . Long term current use of anticoagulant therapy 12/02/2012  . H/O aortic valve replacement 12/02/2012  . H/O mitral valve replacement 12/02/2012    Past Surgical History:  Procedure Laterality Date  . AVR and MVR  July 2010   Dr. Barry Dieneswens: 21 mm Sorin Carbomedics mechanical aortic valve ; 33 mm Sorin Carbomedics Optiform mechanical prosthetic mitral valve  . CARDIAC CATHETERIZATION  June 2010   Normal coronary arteries  . NM MYOVIEW LTD  May 2013   No ischemia or infarction  . TEE WITHOUT CARDIOVERSION N/A 05/21/2015   Procedure: TRANSESOPHAGEAL ECHOCARDIOGRAM (TEE);  Surgeon: Lars MassonKatarina H Nelson, MD;  [facility];  Service: Cardiovascular;  Laterality: N/A;  . TRANSTHORACIC ECHOCARDIOGRAM  May 2013   EF 50-55% with no regional wall motion abnormalities;Well-seated aortic valve with normal gradients (peak 41, mean 23 mmHg); well-seated mitral prosthetic valve no regurgitation normal gradients; mild concentric LVH with impaired relaxation and moderate dilated left atrium.      OB History    No data  available       Home Medications    Prior to Admission medications   Medication Sig Start Date End Date Taking? Authorizing Provider  amoxicillin (AMOXIL) 500 MG tablet Take 500 mg by mouth. Prior to dental procedures   Yes Historical Provider, MD  aspirin EC 81 MG tablet Take 1 tablet (81 mg total) by mouth daily. 04/29/15   Beatrice LecherScott T Weaver, PA-C  warfarin  (COUMADIN) 7.5 MG tablet TAKE 1 TO 1&1/2 TABLETS BY MOUTH EVERY DAY AS DIRECTED 05/15/16   Marykay Lexavid W Harding, MD    Family History Family History  Problem Relation Age of Onset  . Heart disease Mother   . Hyperlipidemia Mother   . Hyperlipidemia Father     Social History Social History  Substance Use Topics  . Smoking status: Never Smoker  . Smokeless tobacco: Never Used  . Alcohol use No     Allergies   Ace inhibitors and Oxycodone   Review of Systems Review of Systems  Constitutional: Negative for fever.  HENT: Positive for facial swelling. Negative for trouble swallowing.   Eyes: Negative for redness.  Respiratory: Negative for shortness of breath, wheezing and stridor.   Cardiovascular: Negative for chest pain.  Gastrointestinal: Negative for nausea and vomiting.  Musculoskeletal: Negative for myalgias.  Skin: Positive for color change and rash.  Neurological: Negative for light-headedness.  Psychiatric/Behavioral: Negative for confusion.     Physical Exam Updated Vital Signs Temp 97.8 F (36.6 C) (Oral)   Ht 5\' 4"  (1.626 m)   Wt 63.5 kg   BMI 24.03 kg/m   Physical Exam  Constitutional: She appears well-developed and well-nourished.  HENT:  Head: Normocephalic and atraumatic.  Mouth/Throat: Oropharynx is clear and moist.  No intraoral involvement, angioedema.  Eyes: Conjunctivae are normal. Right eye exhibits no discharge. Left eye exhibits no discharge.  Neck: Normal range of motion. Neck supple.  Cardiovascular: Normal rate, regular rhythm and normal heart sounds.   Pulmonary/Chest: Effort normal and breath sounds normal.  Abdominal: Soft. There is no tenderness.  Neurological: She is alert.  Skin: Skin is warm and dry.  Patient with erythema bilateral cheeks with mild edema. Slightly weeping on right. Patient has less confluent area of rash on upper chest. No signs of cellulitis or abscess.  Psychiatric: She has a normal mood and affect.  Nursing note  and vitals reviewed.    ED Treatments / Results   Procedures Procedures (including critical care time)  Medications Ordered in ED Medications  diphenhydrAMINE (BENADRYL) capsule 25 mg (not administered)  predniSONE (DELTASONE) tablet 60 mg (60 mg Oral Given 09/12/16 0022)  famotidine (PEPCID) tablet 20 mg (20 mg Oral Given 09/12/16 0022)     Initial Impression / Assessment and Plan / ED Course  I have reviewed the triage vital signs and the nursing notes.  Pertinent labs & imaging results that were available during my care of the patient were reviewed by me and considered in my medical decision making (see chart for details).  Clinical Course    Patient seen and examined. Discussed with and seen by Dr. Read DriversMolpus.   Vital signs reviewed and are as follows: Temp 97.8 F (36.6 C) (Oral)   Ht 5\' 4"  (1.626 m)   Wt 63.5 kg   BMI 24.03 kg/m   Will treat as contact dermatitis with prednisone taper, antihistamines as needed for itching.   Encouraged return with worsening symptoms, difficulty breathing or swallowing, fever, expanding area of erythema. Patient verbalizes understanding and agrees  with plan.  Final Clinical Impressions(s) / ED Diagnoses   Final diagnoses:  Facial dermatitis  Rash and nonspecific skin eruption   Patient with rash, most consistent with a contact dermatitis. No signs of anaphylaxis. Treatment as above. Patient appears stable.  New Prescriptions New Prescriptions   DIPHENHYDRAMINE (BENADRYL) 25 MG CAPSULE    Take 1 capsule (25 mg total) by mouth every 6 (six) hours as needed.   FAMOTIDINE (PEPCID) 20 MG TABLET    Take 1 tablet (20 mg total) by mouth 2 (two) times daily.   PREDNISONE (DELTASONE) 20 MG TABLET    3 Tabs PO Days 1-3, then 2 tabs PO Days 4-6, then 1 tab PO Day 7-9, then Half Tab PO Day [address] PA-C 09/12/16 0038    Paula Libra, MD 09/12/16 [phone]

## 2016-09-12 NOTE — Discharge Instructions (Signed)
Please read and follow all provided instructions.  Your diagnoses today include:  1. Facial dermatitis   2. Rash and nonspecific skin eruption     Tests performed today include:  Vital signs. See below for your results today.   Medications prescribed:   Prednisone - steroid medicine   It is best to take this medication in the morning to prevent sleeping problems. If you are diabetic, monitor your blood sugar closely and stop taking Prednisone if blood sugar is over 300. Take with food to prevent stomach upset.    Benadryl (diphenhydramine) - antihistamine  You can find this medication over-the-counter.   DO NOT exceed:   50mg  Benadryl every 6 hours    Benadryl will make you drowsy. DO NOT drive or perform any activities that require you to be awake and alert if taking this.   Pepcid (famotidine) - antihistamine  You can find this medication over-the-counter.   DO NOT exceed:   20mg  Pepcid every 12 hours  Take any prescribed medications only as directed.  Home care instructions:   Follow any educational materials contained in this packet  Follow-up instructions: Please follow-up with your primary care provider in the next 3 days for further evaluation of your symptoms.   Return instructions:   Please return to the Emergency Department if you experience worsening symptoms.   Call 9-1-1 immediately if you have an allergic reaction that involves your lips, mouth, throat or if you have any difficulty breathing. This is a life-threatening emergency.   Please return if you have any other emergent concerns.  Additional Information:  Your vital signs today were: Temp 97.8 F (36.6 C) (Oral)    Ht 5\' 4"  (1.626 m)    Wt 63.5 kg    BMI 24.03 kg/m  If your blood pressure (BP) was elevated above 135/85 this visit, please have this repeated by your doctor within one month. --------------

## 2017-05-07 ENCOUNTER — Telehealth: Payer: Self-pay | Admitting: Cardiology

## 2017-05-07 NOTE — Telephone Encounter (Signed)
Pt is scheduled to see Dr Herbie Baltimore Monday.Should she have had an Echo before this office visit?

## 2017-05-07 NOTE — Telephone Encounter (Signed)
Left message for pt to come to appt 05-10-17 and discuss with Dr Herbie Baltimore about ECHO.  Appt is 05-10-17 w/Dr harding will not be able to have ECHO before this appt.

## 2017-05-07 NOTE — Telephone Encounter (Signed)
Spoke to patient. She is aware to come to 05/10/17 appointment. Last office visit 2016. Patient had echo in 2017 Patient will discuss with doctor - if annual echo is needed

## 2017-05-10 ENCOUNTER — Ambulatory Visit (INDEPENDENT_AMBULATORY_CARE_PROVIDER_SITE_OTHER): Payer: 59 | Admitting: Cardiology

## 2017-05-10 VITALS — BP 130/80 | HR 77 | Ht 64.0 in | Wt 146.0 lb

## 2017-05-10 DIAGNOSIS — Z7901 Long term (current) use of anticoagulants: Secondary | ICD-10-CM | POA: Diagnosis not present

## 2017-05-10 DIAGNOSIS — I10 Essential (primary) hypertension: Secondary | ICD-10-CM | POA: Diagnosis not present

## 2017-05-10 DIAGNOSIS — Z952 Presence of prosthetic heart valve: Secondary | ICD-10-CM | POA: Diagnosis not present

## 2017-05-10 MED ORDER — WARFARIN SODIUM 7.5 MG PO TABS: ORAL_TABLET | ORAL | 2 refills | Status: DC

## 2017-05-10 NOTE — Patient Instructions (Signed)
NO CHANGE WITH MEDICATIONS    WILL SCHEDULE ECHO FOR July - AUG 2020    Your physician wants you to follow-up in12 MONTHS WITH DR HARDING - IF OKAY MAY COME 2020 AFTER ECHO. You will receive a reminder letter in the mail two months in advance. If you don't receive a letter, please call our office to schedule the follow-up appointment.

## 2017-05-10 NOTE — Progress Notes (Signed)
PCP: Patient, [name]  Clinic Note: Chief Complaint  Patient presents with  . Follow-up    Aortic and mitral valve replacement    HPI: [name] is a 36 y.o. female with a PMH below who presents today for 2 Yr f/u for Rheumatic Heart Disease s/p Mechanical AVR/MVR 2010.  [name] was last seen in Sept 2016 (did not come last year after her echo was read as stable /unchanged & she was asymptomatic).  Recent Hospitalizations: none  Studies Personally Reviewed - (if available, images/films reviewed: From Epic Chart or Care Everywhere)  2 D Echo Oct 2017: EF 65-70%. No RWMA.  21 mm mechanical aortic valve well-seated. Leaflets open well. Peak/mean gradient is 31/15 mmHg. Mild AI. Normal aortic root. 30 mm mechanical mitral valve well-seated. Leaflets open well. Mild MR. Normal gradients. Dilated left atrium.  Interval History: [name] presents today for follow-up doing quite well with no major complaints. Also notes that she may feel a little bit short of breath that she "pushes it too much. Otherwise is doing quite well with no resting or exertional chest tightness or pressure. No dyspnea with routine exertion. No PND, orthopnea or edema. What she does notice intermittent pinching sensations just under her sternal scar. This is usually with certain movements or deep breaths  No palpitations, lightheadedness, dizziness, weakness or syncope/near syncope. No TIA/amaurosis fugax symptoms. No melena, hematochezia, hematuria, or epstaxis.  ROS: A comprehensive was performed. Pertinent symptoms noted above Review of Systems  Constitutional: Negative for malaise/fatigue.  HENT: Negative for congestion and sinus pain.   Respiratory: Negative for cough, shortness of breath and wheezing.   Cardiovascular: Negative for claudication.  Gastrointestinal: Negative for abdominal pain and constipation.  Musculoskeletal: Negative.   Skin: Negative.   Neurological: Negative for dizziness.    Endo/Heme/Allergies: Bruises/bleeds easily.  Psychiatric/Behavioral: Negative.   All other systems reviewed and are negative.  I have reviewed and (if needed) personally updated the patient's problem list, medications, allergies, past medical and surgical history, social and family history.   Past Medical History:  Diagnosis Date  . Borderline hypertension   . History of echocardiogram    Echo 8/16: Vigorous LVF, EF 65-70%, normal wall motion, paradoxical ventricular septal motion, mechanical AVR okay (mean 23 mmHg, peak 50 mmHg), trivial AI, mechanical MVR okay with trivial MR and normal gradients, mild LAE, normal RV function, mild TR, mild PI  . History of stress test    ETT-echo 8/16: Normal  . History of transesophageal echocardiography (TEE) for monitoring    TEE 9/16: Mild LVH, vigorous LVF, EF 65-70%, normal wall motion, mechanical AVR with peak/mean gradients 31/15 mmHg with suboptimal angle, mild AI, mechanical MVR with normal transmitral gradients, mild MR, no LAA clot, no arm or a a clot  . Postoperative atrial fibrillation (HCC)    No recurrence  . Rheumatic valvular disease July 2010; 04/2013   Status post AVR (21 mm Sorin CarboMedics mechanical) and MVR (32 mm Sorin Carbometrics Opteform mechanical) for Aortic and mitral valve stenosis with regurgitation;; b) F/u Echo 04/2013: Normal LV size and thickness. EF 60-65%. 21 mm mechanical prosthetic AoV & 33 mm mechanical MV present and functioning normally.  trivial regurgitation. No change since 2013  . Seasonal allergies     Past Surgical History:  Procedure Laterality Date  . AVR and MVR  July 2010   Dr. Barry Dienes: 21 mm Sorin Carbomedics mechanical aortic valve ; 33 mm Sorin Carbomedics Optiform mechanical prosthetic mitral valve  .  CARDIAC CATHETERIZATION  June 2010   Normal coronary arteries  . NM MYOVIEW LTD  May 2013   No ischemia or infarction  . TEE WITHOUT CARDIOVERSION N/A 05/21/2015   Procedure: TRANSESOPHAGEAL  ECHOCARDIOGRAM (TEE);  Surgeon: Lars Masson, MD;  Location: [facility] ENDOSCOPY;  Service: Cardiovascular;  Laterality: N/A;  . TRANSTHORACIC ECHOCARDIOGRAM  May 2013   EF 50-55% with no regional wall motion abnormalities;Well-seated aortic valve with normal gradients (peak 41, mean 23 mmHg); well-seated mitral prosthetic valve no regurgitation normal gradients; mild concentric LVH with impaired relaxation and moderate dilated left atrium.    . TRANSTHORACIC ECHOCARDIOGRAM  06/2016   EF 65-70%. No RWMA.  21 mm mechanical aortic valve well-seated. Leaflets open well. Peak/mean gradient is 31/15 mmHg. Mild AI. Normal aortic root. 30 mm mechanical mitral valve well-seated. Leaflets open well. Mild MR. Normal gradients. Dilated left atrium.    Current Meds  Medication Sig  . amoxicillin (AMOXIL) 500 MG tablet Take 500 mg by mouth as directed. Prior to dental procedures   . aspirin EC 81 MG tablet Take 1 tablet (81 mg total) by mouth daily.  Marland Kitchen warfarin (COUMADIN) 7.5 MG tablet TAKE 1 TO 1&1/2 TABLETS BY MOUTH EVERY DAY AS DIRECTED  . [DISCONTINUED] warfarin (COUMADIN) 7.5 MG tablet TAKE 1 TO 1&1/2 TABLETS BY MOUTH EVERY DAY AS DIRECTED    Allergies  Allergen Reactions  . Oxycodone Other (See Comments)    hallucinate  . Ace Inhibitors Cough    Social History   Social History  . Marital status: Married    Spouse name: N/A  . Number of children: N/A  . Years of education: N/A   Social History Main Topics  . Smoking status: Never Smoker  . Smokeless tobacco: Never Used  . Alcohol use No  . Drug use: No  . Sexual activity: Not Asked   Other Topics Concern  . None   Social History Narrative   She is a married mother of 2 who exercises regularly for 30 minutes every other day without difficulty.  Has not taken does not smoke.      Her initial postop course from bowel surgery was complicated by mediastinitis and then a large keloid scar along the midline incision.  She also had postop A.  fib that resolved.  She says he finally starting to get "back to herself this past year "    family history includes Heart disease in her mother; Hyperlipidemia in her father and mother.  Wt Readings from Last 3 Encounters:  05/10/17 146 lb (66.2 kg)  09/11/16 140 lb (63.5 kg)  05/27/15 144 lb 3.2 oz (65.4 kg)    PHYSICAL EXAM BP 130/80   Pulse 77   Ht 5\' 4"  (1.626 m)   Wt 146 lb (66.2 kg)   BMI 25.06 kg/m  Physical Exam  Constitutional: She is oriented to person, place, and time. She appears well-developed and well-nourished. No distress.  Healthy-appearing  HENT:  Head: Normocephalic and atraumatic.  Neck: Normal range of motion. Neck supple. No hepatojugular reflux and no JVD present. Carotid bruit is not present (Radiating aortic valve murmur). No thyromegaly present.  Cardiovascular: Normal rate, regular rhythm and intact distal pulses.  Exam reveals no gallop and no friction rub.   S1 and S2 both crisp. There is 2-3/6 SEM at base radiating to carotids. Also soft 1/6 diastolic murmur at the left lower sternal border and apex.  Pulmonary/Chest: Effort normal and breath sounds normal. No respiratory distress. She has  no wheezes. She has no rales.  Well-healed keloid scar on the chest wall. There is some tenderness along both sides of the scar where she can feel some sternal wires.  Abdominal: Soft. Bowel sounds are normal. She exhibits no distension. There is no tenderness. There is no rebound and no guarding.  Musculoskeletal: Normal range of motion. She exhibits no edema.  Neurological: She is alert and oriented to person, place, and time.  Skin: Skin is warm and dry. No rash noted. No erythema.  Psychiatric: She has a normal mood and affect. Her behavior is normal. Judgment and thought content normal.  Nursing note and vitals reviewed.    Adult ECG Report  Rate: 71 ;  Rhythm: normal sinus rhythm; borderline left atrial enlargement. T-wave inversions some question inferior  ischemia versus repolarization changes.  Narrative Interpretation: Stable EKG   Other studies Reviewed: Additional studies/ records that were reviewed today include:  Recent Labs:  None available  ASSESSMENT / PLAN: Problem List Items Addressed This Visit    Essential hypertension (Chronic)    Normal blood pressure no longer on antihypertensive agent. No diastolic CHF symptoms.      Relevant Medications   warfarin (COUMADIN) 7.5 MG tablet   Other Relevant Orders   ECHOCARDIOGRAM COMPLETE   H/O aortic valve replacement - Primary (Chronic)    Relatively stable echo from last year. I think follow-up in 2020. Not having any more symptoms now.  Since the leaflets seem to be moving well on the most recent echo, I don't think we need to do a cardiac CT to look for pannus incursion. Would simply follow-up in 2020. No bleeding issues on warfarin.      Relevant Orders   EKG 12-Lead (Completed)   ECHOCARDIOGRAM COMPLETE   H/O mitral valve replacement (Chronic)    Mitral valve working well. Discussed need for prophylactic antibiotics. She has the prescription in place for amoxicillin.      Relevant Orders   EKG 12-Lead (Completed)   ECHOCARDIOGRAM COMPLETE   Long term current use of anticoagulant therapy (Chronic)    On warfarin. Labs followed up here and stable. No bleeding issues.         Current medicines are reviewed at length with the patient today. (+/- concerns) nA The following changes have been made: n/a  Patient Instructions  NO CHANGE WITH MEDICATIONS    WILL SCHEDULE ECHO FOR July - AUG 2020    Your physician wants you to follow-up in12 MONTHS WITH DR Briteny Fulghum - IF OKAY MAY COME 2020 AFTER ECHO. You will receive a reminder letter in the mail two months in advance. If you don't receive a letter, please call our office to schedule the follow-up appointment.    Studies Ordered:   Orders Placed This Encounter  Procedures  . EKG 12-Lead  . ECHOCARDIOGRAM  COMPLETE      Bryan Lemma, M.D., M.S. Interventional Cardiologist   Pager # [phone] Phone # [phone] [address] [place], [place] [address]

## 2017-05-12 ENCOUNTER — Encounter: Payer: Self-pay | Admitting: Cardiology

## 2017-05-12 NOTE — Assessment & Plan Note (Signed)
On warfarin. Labs followed up here and stable. No bleeding issues.

## 2017-05-12 NOTE — Assessment & Plan Note (Signed)
Normal blood pressure no longer on antihypertensive agent. No diastolic CHF symptoms.

## 2017-05-12 NOTE — Assessment & Plan Note (Signed)
Mitral valve working well. Discussed need for prophylactic antibiotics. She has the prescription in place for amoxicillin.

## 2017-05-12 NOTE — Assessment & Plan Note (Signed)
Relatively stable echo from last year. I think follow-up in 2020. Not having any more symptoms now.  Since the leaflets seem to be moving well on the most recent echo, I don't think we need to do a cardiac CT to look for pannus incursion. Would simply follow-up in 2020. No bleeding issues on warfarin.

## 2017-05-13 ENCOUNTER — Telehealth: Payer: Self-pay | Admitting: *Deleted

## 2017-05-13 NOTE — Telephone Encounter (Signed)
SPOKE TO PATIENT .  CALLED TO ASK ABOUT HOME MONITORING OF Katelyn AsalPROTIME [name]/INR  PATIENT STATES SHE MONITOR EVERY MONTH.  THE RANGE HAS BEEN 2.5 -2.8. SHE STATES SHE WAS SUPPOSE TO CALL EVERY 2 MONTHS TO OFFICE BUT SHE ADMITTED SHE HAS NOT CALLED IN QUITE A WHILE . INSTRUCTED PATIENT  TO CONTACT [facility] TO GIVE THEM  THE  INFORMATION BEFORE November. DIRECT PHONE GIVEN. PATIENT VERBALIZED UNDERSTANDING.

## 2017-05-15 LAB — POCT INR: INR: 2.6

## 2017-06-01 ENCOUNTER — Other Ambulatory Visit: Payer: Self-pay | Admitting: Obstetrics and Gynecology

## 2017-06-01 DIAGNOSIS — N63 Unspecified lump in unspecified breast: Secondary | ICD-10-CM

## 2017-06-07 ENCOUNTER — Ambulatory Visit
Admission: RE | Admit: 2017-06-07 | Discharge: 2017-06-07 | Disposition: A | Discharge status: Home / Self Care | Payer: 59 | Source: Ambulatory Visit | Attending: Obstetrics and Gynecology | Admitting: Obstetrics and Gynecology

## 2017-06-07 DIAGNOSIS — N63 Unspecified lump in unspecified breast: Secondary | ICD-10-CM

## 2017-06-21 LAB — POCT INR: INR: 2.8

## 2017-06-22 LAB — POCT INR: INR: 2.8

## 2017-06-24 ENCOUNTER — Ambulatory Visit (INDEPENDENT_AMBULATORY_CARE_PROVIDER_SITE_OTHER): Payer: 59 | Admitting: Pharmacist Clinician (PhC)/ Clinical Pharmacy Specialist

## 2017-06-24 ENCOUNTER — Ambulatory Visit (INDEPENDENT_AMBULATORY_CARE_PROVIDER_SITE_OTHER): Payer: Self-pay | Admitting: Pharmacist Clinician (PhC)/ Clinical Pharmacy Specialist

## 2017-06-24 DIAGNOSIS — Z7901 Long term (current) use of anticoagulants: Secondary | ICD-10-CM

## 2017-06-24 DIAGNOSIS — Z952 Presence of prosthetic heart valve: Secondary | ICD-10-CM

## 2018-05-09 ENCOUNTER — Telehealth: Payer: Self-pay | Admitting: Cardiology

## 2018-05-09 NOTE — Telephone Encounter (Signed)
New message   *STAT* If patient is at the pharmacy, call can be transferred to refill team.   1. Which medications need to be refilled? (please list name of each medication and dose if known) warfarin (COUMADIN) 7.5 MG tablet  2. Which pharmacy/location (including street and city if local pharmacy) is medication to be sent to? [facility] on [address] in [place], [place]  3. Do they need a 30 day or 90 day supply? 90

## 2018-05-11 MED ORDER — WARFARIN SODIUM 7.5 MG PO TABS: ORAL_TABLET | ORAL | 2 refills | Status: DC

## 2018-05-23 ENCOUNTER — Other Ambulatory Visit: Payer: Self-pay | Admitting: Cardiology

## 2018-06-02 ENCOUNTER — Telehealth: Payer: Self-pay | Admitting: *Deleted

## 2018-06-02 NOTE — Telephone Encounter (Signed)
RECEIVED  ANNUAL LABWORK FROM DR VARNADO - LIPID, CBC,CMP,  DR HARDING REVIEWED AND NO CHANGES.   SPOKE TO PATIENT - INFORMED  HER NO CHANGES .  NO ISSUE AT PRESENT PER PATIENT. WILL  PLACE RECALL FOR 2020 AFTER ECHO IS COMPLETED PER ORDER FROM DR HARDING. PATIENT AWARE AND VERBALIZED UNDERSTANDING.

## 2018-06-21 ENCOUNTER — Ambulatory Visit: Payer: Self-pay | Admitting: Pharmacist Clinician (PhC)/ Clinical Pharmacy Specialist

## 2018-06-21 DIAGNOSIS — Z7901 Long term (current) use of anticoagulants: Secondary | ICD-10-CM

## 2018-06-21 DIAGNOSIS — Z952 Presence of prosthetic heart valve: Secondary | ICD-10-CM

## 2018-07-07 ENCOUNTER — Telehealth: Payer: Self-pay | Admitting: Cardiology

## 2018-07-07 NOTE — Telephone Encounter (Signed)
New Message   Pt states she would like to speak to the nurse about some letters from the Dr. Herbie Baltimore. Please call

## 2018-07-07 NOTE — Telephone Encounter (Signed)
Patient wanted to know if she can have a letter for jury duty - so she does nt have to attend.  Schedule for nov 13,2019.  RN  Informed patient will defer to Dr Herbie Baltimore and contact her with the answer

## 2018-07-08 NOTE — Telephone Encounter (Signed)
Patient called stating she already took care of the matter, and doesn't need the letter she spoke to you about yesterday.

## 2019-04-25 ENCOUNTER — Other Ambulatory Visit: Payer: Self-pay

## 2019-04-25 ENCOUNTER — Encounter (INDEPENDENT_AMBULATORY_CARE_PROVIDER_SITE_OTHER): Payer: Self-pay

## 2019-04-25 ENCOUNTER — Ambulatory Visit (HOSPITAL_COMMUNITY): Payer: BC Managed Care – PPO | Attending: Cardiology

## 2019-04-25 DIAGNOSIS — Z952 Presence of prosthetic heart valve: Secondary | ICD-10-CM | POA: Diagnosis not present

## 2019-04-25 DIAGNOSIS — I10 Essential (primary) hypertension: Secondary | ICD-10-CM | POA: Insufficient documentation

## 2019-04-26 ENCOUNTER — Other Ambulatory Visit: Payer: Self-pay

## 2019-04-26 DIAGNOSIS — Z952 Presence of prosthetic heart valve: Secondary | ICD-10-CM

## 2019-04-26 NOTE — Progress Notes (Signed)
Notes recorded by Leonie Man, MD on 04/25/2019 at 4:45 PM EDT  Echocardiogram resuts: Pretty Stable compared to 2017. OK for 3 year re-eval.  Normal pump function & wall movement.  Aortic & Mitral Valve Prostheses are both functioning well with acceptable levels of pressure gradients. No notable leaking around the valves.   Good news!.   DH  H/O aortic/mitral valve replacement

## 2019-05-29 ENCOUNTER — Other Ambulatory Visit (HOSPITAL_COMMUNITY)
Admission: RE | Admit: 2019-05-29 | Discharge: 2019-05-29 | Disposition: A | Discharge status: Home / Self Care | Payer: BC Managed Care – PPO | Source: Ambulatory Visit | Attending: Obstetrics and Gynecology | Admitting: Obstetrics and Gynecology

## 2019-05-29 ENCOUNTER — Other Ambulatory Visit: Payer: Self-pay | Admitting: Obstetrics and Gynecology

## 2019-05-29 DIAGNOSIS — Z13220 Encounter for screening for lipoid disorders: Secondary | ICD-10-CM | POA: Diagnosis not present

## 2019-05-29 DIAGNOSIS — Z131 Encounter for screening for diabetes mellitus: Secondary | ICD-10-CM | POA: Diagnosis not present

## 2019-05-29 DIAGNOSIS — Z124 Encounter for screening for malignant neoplasm of cervix: Secondary | ICD-10-CM | POA: Insufficient documentation

## 2019-05-29 DIAGNOSIS — Z01419 Encounter for gynecological examination (general) (routine) without abnormal findings: Secondary | ICD-10-CM | POA: Diagnosis not present

## 2019-05-30 LAB — CYTOLOGY - PAP
Diagnosis: NEGATIVE
HPV: NOT DETECTED

## 2019-06-05 ENCOUNTER — Other Ambulatory Visit: Payer: Self-pay

## 2019-06-05 ENCOUNTER — Ambulatory Visit (INDEPENDENT_AMBULATORY_CARE_PROVIDER_SITE_OTHER): Payer: BC Managed Care – PPO | Admitting: Cardiology

## 2019-06-05 VITALS — BP 124/72 | HR 73 | Temp 96.8°F | Ht 63.5 in | Wt 157.8 lb

## 2019-06-05 DIAGNOSIS — Z7901 Long term (current) use of anticoagulants: Secondary | ICD-10-CM

## 2019-06-05 DIAGNOSIS — I10 Essential (primary) hypertension: Secondary | ICD-10-CM

## 2019-06-05 DIAGNOSIS — Z952 Presence of prosthetic heart valve: Secondary | ICD-10-CM

## 2019-06-05 DIAGNOSIS — R002 Palpitations: Secondary | ICD-10-CM

## 2019-06-05 HISTORY — DX: Palpitations: R00.2

## 2019-06-05 MED ORDER — PROPRANOLOL HCL 10 MG PO TABS: 10.0000 mg | ORAL_TABLET | Freq: Two times a day (BID) | ORAL | 6 refills | Status: DC | PRN

## 2019-06-05 MED ORDER — AMOXICILLIN 500 MG PO TABS: 2000.0000 mg | ORAL_TABLET | ORAL | 4 refills | Status: DC

## 2019-06-05 NOTE — Patient Instructions (Addendum)
Medication Instructions:   - -start as needed-- PROPRANOLOL 10 MG UP TO TWICE A DAY - PALPATION - IF YOU TAKE A TOTAL 5 THEN WEAN DOWN - BY TAKING 1 TABLET A DAY FOR 2 DAYS THEN STOP    If you need a refill on your cardiac medications before your next appointment, please call your pharmacy.   Lab work: Not needed   Testing/Procedures:  WILL BE SCHEDULE BE [place] [address] - zio patch  -if you develop for palpations - contact office   Follow-Up: At [facility], you and your health needs are our priority.  As part of our continuing mission to provide you with exceptional heart care, we have created designated Provider Care Teams.  These Care Teams include your primary Cardiologist (physician) and Advanced Practice Providers (APPs -  Physician Assistants and Nurse Practitioners) who all work together to provide you with the care you need, when you need it. . You will need a follow up appointment in  6 months- March 2021.  Please call our office 2 months in advance to schedule this appointment.  You may see Glenetta Hew, MD or one of the following Advanced Practice Providers on your designated Care Team:   . Rosaria Ferries, PA-C . Jory Sims, DNP, ANP  Any Other Special Instructions Will Be Listed Below (If Applicable).

## 2019-06-05 NOTE — Progress Notes (Signed)
PCP: Patient, [name]  Clinic Note: Chief Complaint  Patient presents with  . Follow-up    Every 2 years.  Following echo  . Cardiac Valve Problem    Status post mechanical MVR and AVR for rheumatic heart disease  . Tachycardia    HPI: [name] is a 38 y.o. female with a h/o AVR/MVR (2010) for Rheumatic Heart Disease who presents for 2 yr f/u.    [name] was last seen in Aug 2018 -- doing well. Only DOE if "pushes it".  No issues with warfarin.  Back in August - called in b/c HR was running high. (up to 200 bpm).  Has a little L CP noted with HRs > 170s.    Recent Hospitalizations: none  Studies Personally Reviewed - (if available, images/films reviewed: From Epic Chart or Care Everywhere)  2 D Echo Oct 2017: EF 65-70%. No RWMA.  21 mm mechanical aortic valve well-seated. Leaflets open well. Peak/mean gradient is 31/15 mmHg. Mild AI. Normal aortic root. 30 mm mechanical mitral valve well-seated. Leaflets open well. Mild MR. Normal gradients. Dilated left atrium.  2 D Echo Oct 2020: EF 60-65%.  ? Restrictive filling pressures (does not correlate with mild-mod LA dilation).  Mild-Mod LA dilation. Normal RV.   S/P 22mm Sorin Carvomedics mechanical MVR (mean gradient 3 mmHg).  S/P 50mm Sorin Carbomedics mechanical AVR which is functioning normally. The mean AVG is 22-81mmHg and there is trivial perivavluar AI. Compared to prior echo there is no significant change in AVG (11mmHg on prior echo).  Interval History: [name] presents today for 2 yr follow-up mostly noting concerns for rapid HR noted on smart watch.  She will have spells where her heart rate will go up to almost 200 beats a minute.  Sometimes stay at 170 beats a minute for little while.  Interestingly though this happens at rest, but it can be made worse with exertion.  She does not really notice feeling dizzy just a little bit winded and tired.  Maybe if it is really fast a little bit of tightness in her chest.   The symptoms really were bad about a month ago but in the last 2 weeks she has not had hardly any of these spells.  She cannot tell me that that she has been drinking any more caffeine or any anything else unusual.  No more stress than usual.  She is carefully tell what the differences between a month or so ago and now.  She does have lots of work-related stress as well as home stress secondary to the COVID-19 issues.  But not much is changed with that and so she is not set sure why symptoms will improve.  Interestingly, she does note that most the time when she is feeling these episodes there at work.  When she is actually doing her routine exercise if not there nor when she is resting or sleeping.  She pulled up a couple recordings on her apple watch showing the fast heart rates, but I could not see the strips. Interestingly with these fast heart rate she has not felt syncope or near syncope type symptoms.  A little bit discomfort in wound in the chest and a little bit of dyspnea but no other symptoms.  Otherwise fine from a cardiac standpoint.  Maybe little short of breath if she overdoes it with exertion but not with routine activity or exercise.  No chest pain or pressure with exception of noted above.  No heart failure symptoms of PND, orthopnea or edema. No TIA or amaurosis fugax symptoms.  Notes abdominal pain with taking ASA daily -- better taking it QOD, but still has upset stomach & more bruising with ASA.  -- She does take ASA with rapid HR spells.    ROS: A comprehensive was performed. Pertinent symptoms noted above Review of Systems  Constitutional: Negative for malaise/fatigue.  HENT: Negative for congestion and sinus pain.   Respiratory: Negative for cough, shortness of breath and wheezing.   Cardiovascular: Positive for palpitations. Negative for claudication.  Gastrointestinal: Negative for abdominal pain and constipation.  Musculoskeletal: Negative.  Negative for joint pain.   Skin: Negative.   Neurological: Positive for tingling (In her hands sometimes with a heart rate goes really fast). Negative for dizziness.  Endo/Heme/Allergies: Bruises/bleeds easily.  Psychiatric/Behavioral: Negative.  Negative for memory loss. The patient is not nervous/anxious and does not have insomnia.        Just notes increased stress at work  All other systems reviewed and are negative.  I have reviewed and (if needed) personally updated the patient's problem list, medications, allergies, past medical and surgical history, social and family history.   Past Medical History:  Diagnosis Date  . Borderline hypertension   . Postoperative atrial fibrillation (HCC)    No recurrence  . Rheumatic valvular disease July 2010; 04/2013   Status post AVR (21 mm Sorin CarboMedics mechanical) and MVR (32 mm Sorin Carbometrics Opteform mechanical) for Aortic and mitral valve stenosis with regurgitation;; b) F/u Echo 04/2013: Normal LV size and thickness. EF 60-65%. 21 mm mechanical prosthetic AoV & 33 mm mechanical MV present and functioning normally.  trivial regurgitation. No change since 2013  . S/P AVR (aortic valve replacement) & S/P MVR (mitral valve replacement) 2010   RV.   S/P 30mm Sorin Carvomedics mechanical MVR (mean gradient 3 mmHg).  S/P 21mm Sorin Carbomedics mechanical AVR which is functioning normally. The mean AVG is 22-627mmHg and there is trivial perivavluar AI.  Marland Kitchen. Seasonal allergies     Past Surgical History:  Procedure Laterality Date  . AVR and MVR  July 2010   Dr. Barry Dieneswens: 21 mm Sorin Carbomedics mechanical aortic valve ; 33 mm Sorin Carbomedics Optiform mechanical prosthetic mitral valve  . CARDIAC CATHETERIZATION  June 2010   Normal coronary arteries  . NM MYOVIEW LTD  May 2013   No ischemia or infarction  . TEE WITHOUT CARDIOVERSION N/A 05/21/2015   Procedure: TRANSESOPHAGEAL ECHOCARDIOGRAM (TEE);  Surgeon: Lars MassonKatarina H Nelson, MD;  [facility];  Service:  Cardiovascular;  Laterality: N/A;  . TRANSTHORACIC ECHOCARDIOGRAM  May 2013   EF 50-55% with no regional wall motion abnormalities;Well-seated aortic valve with normal gradients (peak 41, mean 23 mmHg); well-seated mitral prosthetic valve no regurgitation normal gradients; mild concentric LVH with impaired relaxation and moderate dilated left atrium.    . TRANSTHORACIC ECHOCARDIOGRAM  06/2016   EF 65-70%. No RWMA.  21 mm mechanical aortic valve well-seated. Leaflets open well. Peak/mean gradient is 31/15 mmHg. Mild AI. Normal aortic root. 30 mm mechanical mitral valve well-seated. Leaflets open well. Mild MR. Normal gradients. Dilated left atrium.  . TRANSTHORACIC ECHOCARDIOGRAM  October 2020   EF 60-65%.  ? Restrictive filling pressures (does not correlate with mild-mod LA dilation).  Mild-Mod LA dilation. Normal RV.   S/P 30mm Sorin Carvomedics mechanical MVR (mean gradient 3 mmHg).  S/P 21mm Sorin Carbomedics mechanical AVR which is functioning normally. The mean AVG is 22-4227mmHg and there is  trivial perivavluar AI.  vs. Prior Echo -> no significant change in AVG ( )     Current Meds  Medication Sig  . amoxicillin (AMOXIL) 500 MG tablet Take 4 tablets (2,000 mg total) by mouth as directed. Prior to dental procedures  . Probiotic Product (PROBIOTIC ADVANCED PO) Take by mouth.  . warfarin (COUMADIN) 7.5 MG tablet TAKE 1 TO 1&1/2 TABLETS BY MOUTH EVERY DAY AS DIRECTED  . [DISCONTINUED] amoxicillin (AMOXIL) 500 MG tablet Take 500 mg by mouth as directed. Prior to dental procedures     Allergies  Allergen Reactions  . Oxycodone Other (See Comments)    hallucinate  . Ace Inhibitors Cough   Social History   Tobacco Use  . Smoking status: Never Smoker  . Smokeless tobacco: Never Used  Substance Use Topics  . Alcohol use: No  . Drug use: No   Social History   Social History Narrative   She is a married mother of 2 who exercises regularly for 30 minutes every other day without  difficulty.  Has not taken does not smoke.      Her initial postop course from bowel surgery was complicated by mediastinitis and then a large keloid scar along the midline incision.  She also had postop A. fib that resolved.  She says he finally starting to get "back to herself this past year "    family history includes Heart disease in her mother; Hyperlipidemia in her father and mother.  Wt Readings from Last 3 Encounters:  06/05/19 157 lb 12.8 oz (71.6 kg)  05/10/17 146 lb (66.2 kg)  09/11/16 140 lb (63.5 kg)    PHYSICAL EXAM BP 124/72 (BP Location: Right Arm, Patient Position: Sitting, Cuff Size: Normal)   Pulse 73   Temp (!) 96.8 F (36 C)   Ht 5' 3.5" (1.613 m)   Wt 157 lb 12.8 oz (71.6 kg)   SpO2 98%   BMI 27.51 kg/m  Physical Exam  Constitutional: She is oriented to person, place, and time. She appears well-developed and well-nourished. No distress.  Healthy-appearing  HENT:  Head: Normocephalic and atraumatic.  Neck: Normal range of motion. Neck supple. No hepatojugular reflux and no JVD present. Carotid bruit is not present (Radiating aortic valve murmur). No thyromegaly present.  Cardiovascular: Normal rate, regular rhythm and intact distal pulses.  Occasional extrasystoles are present. PMI is not displaced. Exam reveals no gallop and no friction rub.  Murmur heard. High-pitched harsh crescendo-decrescendo midsystolic murmur is present with a grade of 2/6 at the upper right sternal border radiating to the neck.  Low-pitched early diastolic murmur is present with a grade of 1/6 at the upper left sternal border. Crisp mechanical S1 and S2  Pulmonary/Chest: Effort normal and breath sounds normal. No respiratory distress. She has no wheezes. She has no rales.  Well-healed keloid scar on the chest wall. There is some tenderness along both sides of the scar where she can feel some sternal wires.  Abdominal: Soft. Bowel sounds are normal. She exhibits no distension. There is  no abdominal tenderness. There is no rebound and no guarding.  Musculoskeletal: Normal range of motion.        General: No edema.  Neurological: She is alert and oriented to person, place, and time.  Skin: Skin is warm and dry. No rash noted. No erythema.  Psychiatric: She has a normal mood and affect. Her behavior is normal. Judgment and thought content normal.  Nursing note and vitals reviewed.    Adult  ECG Report  Rate: 76 ;  Rhythm: normal sinus rhythm; borderline septal Q waves versus I RBBB.  Otherwise normal axis, intervals and durations  Narrative Interpretation: Stable EKG   Other studies Reviewed: Additional studies/ records that were reviewed today include:  Recent Labs: May 29, 2019-TC 198, HDL 53, LDL of 125, TG 99.  Hgb 13.9. Cr 0.59, K+ to 3.8.  INR 2.8.  ASSESSMENT / PLAN:  Reca returns here today for every two-year visit doing pretty well overall from a cardiac standpoint.  Major issue right now is that tachycardia spells that thankfully seem to have eased off now.  Patient would like to get her on a monitor if they recur.  Echocardiogram was stable.  Reassuring results on the aortic valve.  Probably recheck in 3 years.  Problem List Items Addressed This Visit    H/O aortic valve replacement (Chronic)    Normal functioning valve on echo.   No further suggestion of pannus incursion on current echo.  On warfarin.  Probably recheck echo in 3 more years.      Relevant Orders   EKG 12-Lead (Completed)   H/O mitral valve replacement (Chronic)    Stable mechanical valve on echo.  Remains on anticoagulation  SBE prophylaxis amoxicillin prescription in place.      Relevant Orders   EKG 12-Lead (Completed)   Long term current use of anticoagulant therapy (Chronic)    On warfarin for double mechanical valves.  She monitors her own INR at home.  No bleeding issues.      Relevant Orders   EKG 12-Lead (Completed)   Essential hypertension (Chronic)    Her  blood pressures look well-controlled.  She is not on any medications.      Relevant Medications   propranolol (INDERAL) 10 MG tablet   Other Relevant Orders   EKG 12-Lead (Completed)   Rapid palpitations - Primary    What she is describing is somewhat concerning for possible SVT since the heart rates of up to 200s.  More likely than A. fib which would probably not be as fast..  Would be weird for this to occur this far out from her valve surgeries.  It is helpful that she is wearing her smart watch, and it be nice to get some of the strips downloaded to her phone.  Unfortunately when it comes to try to diagnose these episodes, she is not having any more or.  Would like to have her wear an event monitor when these episodes occur.    I have asked her to let us know if these episodes occur so we can order a monitor. I am going to prescribe as needed propranolol 10 mg up to twice a day for palpitations. Also discussed vagal maneuvers and ensuring adequate hydration along with avoiding triggers.      Relevant Orders   EKG 12-Lead (Completed)      Current medicines are reviewed at length with the patient today. (+/- concerns) nA The following changes have been made: n/a  Patient Instructions  Medication Instructions:   - -start as needed-- PROPRANOLOL 10 MG UP TO TWICE A DAY - PALPATION - IF YOU TAKE A TOTAL 5 THEN WEAN DOWN - BY TAKING 1 TABLET A DAY FOR 2 DAYS THEN STOP    If you need a refill on your cardiac medications before your next appointment, please call your pharmacy.   Lab work: Not needed   Testing/Procedures:  WILL BE SCHEDULE BE [address]  [address] - zio patch  -if you develop for palpations - contact office   Follow-Up: At [facility], you and your health needs are our priority.  As part of our continuing mission to provide you with exceptional heart care, we have created designated Provider Care Teams.  These Care Teams include your primary  Cardiologist (physician) and Advanced Practice Providers (APPs -  Physician Assistants and Nurse Practitioners) who all work together to provide you with the care you need, when you need it. . You will need a follow up appointment in  6 months- March 2021.  Please call our office 2 months in advance to schedule this appointment.  You may see Bryan Lemmaavid Harding, MD or one of the following Advanced Practice Providers on your designated Care Team:   . Theodore DemarkRhonda Barrett, PA-C . Joni ReiningKathryn Lawrence, DNP, ANP  Any Other Special Instructions Will Be Listed Below (If Applicable).   Studies Ordered:   Orders Placed This Encounter  Procedures  . EKG 12-Lead      Bryan Lemmaavid Harding, M.D., M.S. Interventional Cardiologist   Pager # [phone] Phone # [phone] [address] [place], [place] [address]

## 2019-06-07 ENCOUNTER — Encounter: Payer: Self-pay | Admitting: Cardiology

## 2019-06-07 NOTE — Assessment & Plan Note (Signed)
On warfarin for double mechanical valves.  She monitors her own INR at home.  No bleeding issues.

## 2019-06-07 NOTE — Assessment & Plan Note (Signed)
Her blood pressures look well-controlled.  She is not on any medications.

## 2019-06-07 NOTE — Assessment & Plan Note (Signed)
Normal functioning valve on echo.   No further suggestion of pannus incursion on current echo.  On warfarin.  Probably recheck echo in 3 more years.

## 2019-06-07 NOTE — Assessment & Plan Note (Addendum)
What she is describing is somewhat concerning for possible SVT since the heart rates of up to 200s.  More likely than A. fib which would probably not be as fast..  Would be weird for this to occur this far out from her valve surgeries.  It is helpful that she is wearing her smart watch, and it be nice to get some of the strips downloaded to her phone.  Unfortunately when it comes to try to diagnose these episodes, she is not having any more or.  Would like to have her wear an event monitor when these episodes occur.    I have asked her to let us know if these episodes occur so we can order a monitor. I am going to prescribe as needed propranolol 10 mg up to twice a day for palpitations. Also discussed vagal maneuvers and ensuring adequate hydration along with avoiding triggers.

## 2019-06-07 NOTE — Assessment & Plan Note (Addendum)
Stable mechanical valve on echo.  Remains on anticoagulation  SBE prophylaxis amoxicillin prescription in place.

## 2019-06-09 ENCOUNTER — Ambulatory Visit: Payer: 59 | Admitting: Cardiology

## 2019-06-09 ENCOUNTER — Encounter

## 2019-08-14 ENCOUNTER — Other Ambulatory Visit: Payer: Self-pay | Admitting: Cardiology

## 2019-10-03 DIAGNOSIS — Z0000 Encounter for general adult medical examination without abnormal findings: Secondary | ICD-10-CM | POA: Diagnosis not present

## 2019-10-03 DIAGNOSIS — Z952 Presence of prosthetic heart valve: Secondary | ICD-10-CM | POA: Diagnosis not present

## 2019-10-03 DIAGNOSIS — Z7901 Long term (current) use of anticoagulants: Secondary | ICD-10-CM | POA: Diagnosis not present

## 2019-10-03 DIAGNOSIS — L2089 Other atopic dermatitis: Secondary | ICD-10-CM | POA: Diagnosis not present

## 2020-02-19 ENCOUNTER — Ambulatory Visit: Payer: BC Managed Care – PPO | Admitting: Cardiology

## 2020-03-11 ENCOUNTER — Other Ambulatory Visit: Payer: Self-pay

## 2020-03-11 ENCOUNTER — Ambulatory Visit (INDEPENDENT_AMBULATORY_CARE_PROVIDER_SITE_OTHER): Payer: BC Managed Care – PPO | Admitting: Cardiology

## 2020-03-11 ENCOUNTER — Encounter: Payer: Self-pay | Admitting: Cardiology

## 2020-03-11 VITALS — BP 108/80 | HR 72 | Temp 97.2°F | Ht 63.5 in | Wt 155.0 lb

## 2020-03-11 DIAGNOSIS — Z952 Presence of prosthetic heart valve: Secondary | ICD-10-CM

## 2020-03-11 DIAGNOSIS — I10 Essential (primary) hypertension: Secondary | ICD-10-CM | POA: Diagnosis not present

## 2020-03-11 DIAGNOSIS — R002 Palpitations: Secondary | ICD-10-CM | POA: Diagnosis not present

## 2020-03-11 DIAGNOSIS — Z7901 Long term (current) use of anticoagulants: Secondary | ICD-10-CM

## 2020-03-11 MED ORDER — AMOXICILLIN 500 MG PO TABS: 2000.0000 mg | ORAL_TABLET | ORAL | 4 refills | Status: DC

## 2020-03-11 MED ORDER — PROPRANOLOL HCL 10 MG PO TABS: 10.0000 mg | ORAL_TABLET | Freq: Two times a day (BID) | ORAL | 6 refills | Status: DC | PRN

## 2020-03-11 NOTE — Patient Instructions (Signed)

## 2020-03-11 NOTE — Progress Notes (Signed)
Primary Care Provider: Patient, [name] Cardiologist: No primary care provider on file. Electrophysiologist: None  Clinic Note: Chief Complaint  Patient presents with  . Follow-up    Doing well  . Cardiac Valve Problem    History of aortic and mitral valve replacement  . Palpitations    Less frequent, well-controlled with as needed propranolol     HPI:    [name] is a 39 y.o. female with a PMH notable for history of AVR/MVR in 2010 for rheumatic heart disease who presents today for 90-month follow-up.  [name] was last seen on June 05, 2019 with symptoms of palpitations and fast heart rate.  Recent Hospitalizations: None  Reviewed  CV studies:    The following studies were reviewed today: (if available, images/films reviewed: From Epic Chart or Care Everywhere) . August 2020 2D Echo: EF 60 to 65%.  ? Restrictive filling pressures (unlikely).  30 mm Sorin [employer]vomed[employer] Mechanincal MV functioning normally, with no MR.  Mean gradient 3 mmHg.  21 mm Sorin [employer] Mechanincal AoV normal functioning.  Mean gradient 22 to 27 mmHg trivial AI.  No significant change (previously 23 mmHg).   Interval History:   [name] returns here today overall doing very well with no major complaints.  She is happy to say that she is not really having any more those fast heart rate spells that she was having.  She says that she is taken propranolol maybe once twice a month unless she is extremely stressed and then she has to take it maybe twice a day, but usually what it just once and she is okay.  Also, when she does feel her heart rate going fast or not nearly as long lasting and not going as fast. She really only takes to the propranolol when she feels a tightness in her chest.  Otherwise she is doing well.  She is very active with no major symptoms whatsoever.  CV Review of Symptoms (Summary) Cardiovascular ROS: positive for - chest pain, rapid heart rate and As  noted above, episodes of tachycardia are rare, associated with shortness of breath and chest tightness-relieved with propranolol negative for - dyspnea on exertion, edema, irregular heartbeat, orthopnea, paroxysmal nocturnal dyspnea, shortness of breath or Syncope/near syncope, TIA/amaurosis fugax, claudication  The patient does not have symptoms concerning for COVID-19 infection (fever, chills, cough, or new shortness of breath).  The patient is practicing social distancing & Masking.    COVID-19 vaccine-Pfizer, second dose April 2021  REVIEWED OF SYSTEMS   Review of Systems  Constitutional: Negative for malaise/fatigue and weight loss.  HENT: Negative for nosebleeds.   Respiratory: Negative for cough.   Gastrointestinal: Negative for blood in stool and melena.  Genitourinary: Negative for hematuria.  Musculoskeletal: Negative for joint pain.  Neurological: Negative for dizziness, focal weakness and weakness.  Psychiatric/Behavioral: Negative for memory loss. The patient is not nervous/anxious and does not have insomnia.    I have reviewed and (if needed) personally updated the patient's problem list, medications, allergies, past medical and surgical history, social and family history.   PAST MEDICAL HISTORY   Past Medical History:  Diagnosis Date  . Borderline hypertension   . H/O aortic valve replacement 12/02/2012  . H/O mitral valve replacement 12/02/2012  . Long term current use of anticoagulant therapy 12/02/2012  . Postoperative atrial fibrillation (HCC)    No recurrence  . Rapid palpitations 06/05/2019  . Rheumatic valvular disease July 2010; 04/2013  Status post AVR (21 mm Sorin [employer] mechanical) and MVR (32 mm Sorin [employer] Opteform mechanical) for Aortic and mitral valve stenosis with regurgitation;; b) F/u Echo 04/2013: Normal LV size and thickness. EF 60-65%. 21 mm mechanical prosthetic AoV & 33 mm mechanical MV present and functioning normally.  trivial  regurgitation. No change since 2013  . S/P AVR (aortic valve replacement) & S/P MVR (mitral valve replacement) 2010   RV.   S/P 68mm Sorin [employer]omed[employer] mechanical MVR (mean gradient 3 mmHg).  S/P 72mm Sorin [employer] mechanical AVR which is functioning normally. The mean AVG is 22-61mmHg and there is trivial perivavluar AI.  Marland Kitchen Seasonal allergies     PAST SURGICAL HISTORY   Past Surgical History:  Procedure Laterality Date  . AVR and MVR  July 2010   Dr. Ricard Dillon: 21 mm Sorin [employer]omed[employer] mechanical aortic valve ; 33 mm Sorin [employer]omedics Optiform mechanical prosthetic mitral valve  . CARDIAC CATHETERIZATION  June 2010   Normal coronary arteries  . NM MYOVIEW LTD  May 2013   No ischemia or infarction  . TEE WITHOUT CARDIOVERSION N/A 05/21/2015   Procedure: TRANSESOPHAGEAL ECHOCARDIOGRAM (TEE);  Surgeon: Dorothy Spark, MD;  Location: [facility];  Service: Cardiovascular;  Laterality: N/A;  . TRANSTHORACIC ECHOCARDIOGRAM  May 2013   EF 50-55% with no regional wall motion abnormalities;Well-seated aortic valve with normal gradients (peak 41, mean 23 mmHg); well-seated mitral prosthetic valve no regurgitation normal gradients; mild concentric LVH with impaired relaxation and moderate dilated left atrium.    . TRANSTHORACIC ECHOCARDIOGRAM  06/2016   EF 65-70%. No RWMA.  21 mm mechanical aortic valve well-seated. Leaflets open well. Peak/mean gradient is 31/15 mmHg. Mild AI. Normal aortic root. 30 mm mechanical mitral valve well-seated. Leaflets open well. Mild MR. Normal gradients. Dilated left atrium.  . TRANSTHORACIC ECHOCARDIOGRAM  October 2020   EF 60-65%.  ? Restrictive filling pressures (does not correlate with mild-mod LA dilation).  Mild-Mod LA dilation. Normal RV.   S/P 10mm Sorin [employer] mechanical MVR (mean gradient 3 mmHg).  S/P 29mm Sorin [employer] mechanical AVR which is functioning normally. The mean AVG is 22-22mmHg and there is trivial perivavluar AI.  vs. Prior Echo -> no  significant change in AVG (74mmHg)     MEDICATIONS/ALLERGIES   Current Meds  Medication Sig  . amoxicillin (AMOXIL) 500 MG tablet Take 4 tablets (2,000 mg total) by mouth as directed. Prior to dental procedures  . Probiotic Product (PROBIOTIC ADVANCED PO) Take by mouth.  . propranolol (INDERAL) 10 MG tablet Take 1 tablet (10 mg total) by mouth 2 (two) times daily as needed.  . triamcinolone ointment (KENALOG) 0.1 % Apply topically 2 (two) times daily.  Marland Kitchen warfarin (COUMADIN) 7.5 MG tablet Take 1 tablet by mouth daily.  . [DISCONTINUED] amoxicillin (AMOXIL) 500 MG tablet Take 4 tablets (2,000 mg total) by mouth as directed. Prior to dental procedures  . [DISCONTINUED] propranolol (INDERAL) 10 MG tablet Take 1 tablet (10 mg total) by mouth 2 (two) times daily as needed.  . [DISCONTINUED] propranolol (INDERAL) 10 MG tablet Take by mouth.    Allergies  Allergen Reactions  . Oxycodone Other (See Comments)    hallucinate  . Ace Inhibitors Cough    SOCIAL HISTORY/FAMILY HISTORY   Reviewed in Epic:  Pertinent findings: No new changes.  Her son is now 13-dealing with a stress associate with his growth is one of the triggers for her palpitations  OBJCTIVE -PE, EKG, labs   Wt Readings from Last 3  Encounters:  03/11/20 155 lb (70.3 kg)  06/05/19 157 lb 12.8 oz (71.6 kg)  05/10/17 146 lb (66.2 kg)    Physical Exam: BP 108/80   Pulse 72   Temp (!) 97.2 F (36.2 C)   Ht 5' 3.5" (1.613 m)   Wt 155 lb (70.3 kg)   SpO2 98%   BMI 27.03 kg/m  Physical Exam Constitutional:      General: She is not in acute distress.    Appearance: Normal appearance. She is normal weight.  HENT:     Head: Normocephalic and atraumatic.  Neck:     Vascular: No carotid bruit (Radiated aortic murmur).  Cardiovascular:     Rate and Rhythm: Normal rate and regular rhythm.     Pulses: Normal pulses and intact distal pulses.     Heart sounds: Murmur heard. High-pitched harsh midsystolic murmur is present  with a grade of 2/6 at the upper right sternal border radiating to the neck.  Low-pitched rumbling early diastolic murmur is present with a grade of 1/4 at the upper left sternal border.  No friction rub. No gallop.      Comments: Crisp metallic S1 and S2. Well-healed sternotomy scar with keloid.  Tenderness along both sides of the scar Pulmonary:     Effort: Pulmonary effort is normal.     Breath sounds: Normal breath sounds. No wheezing, rhonchi or rales.  Chest:     Chest wall: No tenderness.  Abdominal:     General: Abdomen is flat. Bowel sounds are normal. There is no distension.     Palpations: Abdomen is soft. There is no mass (No HSM).     Tenderness: There is no abdominal tenderness.  Musculoskeletal:        General: No swelling or tenderness. Normal range of motion.     Cervical back: Normal range of motion and neck supple.  Skin:    Findings: Rash: 72.  Neurological:     General: No focal deficit present.     Mental Status: She is alert and oriented to person, place, and time.  Psychiatric:        Mood and Affect: Mood normal.        Behavior: Behavior normal.        Thought Content: Thought content normal.      Adult ECG Report  Rate: 72 ;  Rhythm: normal sinus rhythm and Left atrial abnormality; nonspecific ST and T wave changes.;   Narrative Interpretation: Stable EKG  Recent Labs: From September 2020: TC 198, TG 99, HDL 53, LDL 125.    ASSESSMENT/PLAN    Problem List Items Addressed This Visit    H/O aortic valve replacement - Primary (Chronic)    Normal functioning mechanical valve.  No suggestion of issues.  Recheck echo in 2023. Continue warfarin SBE prophylaxis discussed--has Rx for amoxicillin      Relevant Orders   EKG 12-Lead (Completed)   H/O mitral valve replacement (Chronic)    Relatively stable.  Gradients seem to be the same.  Probably can follow-up in another 3 years which would be 2023.  Remains on anticoagulation.   Discussed  importance of SBE prophylaxis--amoxicillin prescription placed.      Relevant Orders   EKG 12-Lead (Completed)   Rapid palpitations (Chronic)    Symptoms seem to be much better controlled.  Less less frequent-associated with stress.  Well controlled with as needed propranolol.  She also does vagal maneuvers and sometimes does not have to take propranolol.  This would argue that these are probably SVT spells.  For now, I would rather use as needed then standing beta-blocker.      Relevant Orders   EKG 12-Lead (Completed)   Long term current use of anticoagulant therapy (Chronic)    She does home monitoring of her INR.  On warfarin for double mechanical valve.  No bleeding issues. --> Discussed importance of making sure that if she is placed on antibiotics or other new medications that she lets her provider know that she is on warfarin.      Essential hypertension (Chronic)    Blood pressure looks great.  Now that her valves of infection, she really does not have hypertension.  She is not on standing propranolol, is on it for as needed.      Relevant Medications   warfarin (COUMADIN) 7.5 MG tablet   propranolol (INDERAL) 10 MG tablet       COVID-19 Education: The signs and symptoms of COVID-19 were discussed with the patient and how to seek care for testing (follow up with PCP or arrange E-visit).   The importance of social distancing and COVID-19 vaccination was discussed today.  I spent a total of with the patient. >  50% of the time was spent in direct patient consultation.  Additional time spent with chart review  / charting (studies, outside notes, etc): 6 Total Time: 24 min   Current medicines are reviewed at length with the patient today.  (+/- concerns) none  Notice: This dictation was prepared with Dragon dictation along with smaller phrase technology. Any transcriptional errors that result from this process are unintentional and may not be corrected upon  review.  Patient Instructions / Medication Changes & Studies & Tests Ordered   Patient Instructions  Medication Instructions:  No changes  *If you need a refill on your cardiac medications before your next appointment, please call your pharmacy*   Lab Work: Not needed   Testing/Procedures: Not needed   Follow-Up: At [facility], you and your health needs are our priority.  As part of our continuing mission to provide you with exceptional heart care, we have created designated Provider Care Teams.  These Care Teams include your primary Cardiologist (physician) and Advanced Practice Providers (APPs -  Physician Assistants and Nurse Practitioners) who all work together to provide you with the care you need, when you need it.  We recommend signing up for the patient portal called "MyChart".  Sign up information is provided on this After Visit Summary.  MyChart is used to connect with patients for Virtual Visits (Telemedicine).  Patients are able to view lab/test results, encounter notes, upcoming appointments, etc.  Non-urgent messages can be sent to your provider as well.   To learn more about what you can do with MyChart, go to [email or web address].    Your next appointment:   12 month(s)  The format for your next appointment:   In Person  Provider:   Bryan Lemma, MD     Studies Ordered:   Orders Placed This Encounter  Procedures  . EKG 12-Lead     Bryan Lemma, M.D., M.S. Interventional Cardiologist   Pager # [phone] Phone # [phone] [address] [place], [place] [address]   Thank you for choosing Heartcare at [facility]!!

## 2020-03-13 ENCOUNTER — Encounter: Payer: Self-pay | Admitting: Cardiology

## 2020-03-13 NOTE — Assessment & Plan Note (Signed)
Blood pressure looks great.  Now that her valves of infection, she really does not have hypertension.  She is not on standing propranolol, is on it for as needed.

## 2020-03-13 NOTE — Assessment & Plan Note (Signed)
Normal functioning mechanical valve.  No suggestion of issues.  Recheck echo in 2023. Continue warfarin SBE prophylaxis discussed--has Rx for amoxicillin

## 2020-03-13 NOTE — Assessment & Plan Note (Signed)
Symptoms seem to be much better controlled.  Less less frequent-associated with stress.  Well controlled with as needed propranolol.  She also does vagal maneuvers and sometimes does not have to take propranolol.  This would argue that these are probably SVT spells.  For now, I would rather use as needed then standing beta-blocker.

## 2020-03-13 NOTE — Assessment & Plan Note (Signed)
Relatively stable.  Gradients seem to be the same.  Probably can follow-up in another 3 years which would be 2023.  Remains on anticoagulation.   Discussed importance of SBE prophylaxis--amoxicillin prescription placed.

## 2020-03-13 NOTE — Assessment & Plan Note (Signed)
She does home monitoring of her INR.  On warfarin for double mechanical valve.  No bleeding issues. --> Discussed importance of making sure that if she is placed on antibiotics or other new medications that she lets her provider know that she is on warfarin.

## 2020-03-14 DIAGNOSIS — R319 Hematuria, unspecified: Secondary | ICD-10-CM | POA: Diagnosis not present

## 2020-03-14 DIAGNOSIS — R300 Dysuria: Secondary | ICD-10-CM | POA: Diagnosis not present

## 2020-03-14 DIAGNOSIS — N390 Urinary tract infection, site not specified: Secondary | ICD-10-CM | POA: Diagnosis not present

## 2020-06-03 DIAGNOSIS — Z113 Encounter for screening for infections with a predominantly sexual mode of transmission: Secondary | ICD-10-CM | POA: Diagnosis not present

## 2020-06-03 DIAGNOSIS — L739 Follicular disorder, unspecified: Secondary | ICD-10-CM | POA: Diagnosis not present

## 2020-06-03 DIAGNOSIS — Z0000 Encounter for general adult medical examination without abnormal findings: Secondary | ICD-10-CM | POA: Diagnosis not present

## 2020-09-05 ENCOUNTER — Other Ambulatory Visit: Payer: Self-pay | Admitting: Cardiology

## 2020-09-20 ENCOUNTER — Telehealth: Payer: Self-pay | Admitting: Cardiology

## 2020-09-20 NOTE — Telephone Encounter (Signed)
*  STAT* If patient is at the pharmacy, call can be transferred to refill team.   1. Which medications need to be refilled? (please list name of each medication and dose if known) warfarin (COUMADIN) 7.5 MG tablet amoxicillin (AMOXIL) 500 MG tablet  2. Which pharmacy/location (including street and city if local pharmacy) is medication to be sent to? [facility] -  [address] [place], [place], [place] [address]  3. Do they need a 30 day or 90 day supply? Patient is requesting a 90-day supply of the Coumadin and a 4-tablet supply of the amoxicillin. She states she is having dental work soon.

## 2020-09-24 MED ORDER — WARFARIN SODIUM 7.5 MG PO TABS: 7.5000 mg | ORAL_TABLET | Freq: Every day | ORAL | 3 refills | Status: DC

## 2020-09-24 MED ORDER — AMOXICILLIN 500 MG PO TABS: 2000.0000 mg | ORAL_TABLET | ORAL | 4 refills | Status: DC

## 2020-09-24 NOTE — Telephone Encounter (Signed)
Reviewed with Dr. Herbie Baltimore - we have refilled warfarin for several years.  Patient has own machine and access to testing supplies.  Tests regularly and Dr. Herbie Baltimore ok to refill medication.

## 2020-09-24 NOTE — Telephone Encounter (Signed)
Amoxicllin refill sent to pharmacy. It does not appear we manage pt warfarin, although last refill was sent in by Dr. Herbie Baltimore. I called pt to see who is managing warfarin. No answer. LVM for her to call back

## 2020-09-24 NOTE — Telephone Encounter (Signed)
It doesn't look like we manage coumadin but pt requesting it and amoxacillin for a dental work will route to pharmd pool for clarification

## 2020-10-14 ENCOUNTER — Other Ambulatory Visit: Payer: Self-pay | Admitting: Cardiology

## 2020-10-15 ENCOUNTER — Other Ambulatory Visit: Payer: Self-pay | Admitting: Cardiology

## 2020-10-21 ENCOUNTER — Other Ambulatory Visit: Payer: Self-pay | Admitting: Obstetrics and Gynecology

## 2020-10-21 DIAGNOSIS — Z1231 Encounter for screening mammogram for malignant neoplasm of breast: Secondary | ICD-10-CM

## 2021-06-09 ENCOUNTER — Other Ambulatory Visit: Payer: Self-pay

## 2021-06-09 ENCOUNTER — Ambulatory Visit
Admission: RE | Admit: 2021-06-09 | Discharge: 2021-06-09 | Disposition: A | Discharge status: Home / Self Care | Payer: BC Managed Care – PPO | Source: Ambulatory Visit | Attending: Obstetrics and Gynecology | Admitting: Obstetrics and Gynecology

## 2021-06-09 DIAGNOSIS — Z1231 Encounter for screening mammogram for malignant neoplasm of breast: Secondary | ICD-10-CM

## 2021-08-04 ENCOUNTER — Other Ambulatory Visit: Payer: Self-pay | Admitting: Cardiology

## 2021-10-04 ENCOUNTER — Other Ambulatory Visit: Payer: Self-pay | Admitting: Cardiology

## 2021-12-09 ENCOUNTER — Other Ambulatory Visit: Payer: Self-pay | Admitting: Cardiology

## 2021-12-10 ENCOUNTER — Other Ambulatory Visit: Payer: Self-pay | Admitting: Cardiology

## 2022-02-02 ENCOUNTER — Telehealth: Payer: Self-pay | Admitting: Cardiology

## 2022-02-02 NOTE — Telephone Encounter (Signed)
Pt c/o of Chest Pain: STAT if CP now or developed within 24 hours  1. Are you having CP right now?  Patient states she is having a little chest tightness right now   2. Are you experiencing any other symptoms (ex. SOB, nausea, vomiting, sweating)?  Tingling between shoulder blades   3. How long have you been experiencing CP?  Denies pain, states she has had chest tightness for the past few days   4. Is your CP continuous or coming and going?  Coming and going   5. Have you taken Nitroglycerin?  No, but she states she has taken Propranolol.

## 2022-02-02 NOTE — Telephone Encounter (Addendum)
Patient called in to triage to report chest tightness. She had an episode last night and took an inderal 10 mg that provided relief. She checked her INR last night and it was 2. She took an extra dose of warfarin 7.5 mg. Today, she is having chest tightness again, esp. with walking. Recommended that she have someone take her to the ED or call EMS. She was hesitant because she said they don't do much there. She stated she thinks it's because of her INR being low. I explained to her that if her chest tightness is due to lack of oxygen to her heart, she needs to be evaluated and treated for that. Also recommended that she take another prn inderal. She said she would have her husband take her.

## 2022-02-03 ENCOUNTER — Telehealth: Payer: Self-pay | Admitting: Cardiology

## 2022-02-03 MED ORDER — PROPRANOLOL HCL 10 MG PO TABS: 10.0000 mg | ORAL_TABLET | Freq: Two times a day (BID) | ORAL | 11 refills | Status: DC | PRN

## 2022-02-03 NOTE — Telephone Encounter (Signed)
*  STAT* If patient is at the pharmacy, call can be transferred to refill team.   1. Which medications need to be refilled? (please list name of each medication and dose if known) propranolol (INDERAL) 10 MG tablet  2. Which pharmacy/location (including street and city if local pharmacy) is medication to be sent to? [id number] - [place], [place] - [address] [facility] [address]  3. Do they need a 30 day or 90 day supply? 90 day

## 2022-02-03 NOTE — Telephone Encounter (Signed)
Patient called back . She states she went to Urgent care last night  as instructed.  She would like to keep appointment with Dr Herbie Baltimore in sept 2023. She states she had an EKG ,Chest - Xray was done.  She is feeling fine. Appointment cancelled 02/04/22.   Patient request refill of propranolol 10  mg  Prescription refilled patient aware.

## 2022-02-03 NOTE — Telephone Encounter (Signed)
Left message for the patient. Appointment was made to discuss her symptoms. 02/04/22.  Left message for patient to call back.

## 2022-02-03 NOTE — Telephone Encounter (Signed)
Patient called back . [name] states [name] went to Urgent care last night  as instructed.  [name] would like to keep appointment with Dr Herbie Baltimore in sept 2023. [name] states [name] had an EKG ,Chest - Xray was done.  [name] is feeling fine. Appointment cancelled 02/04/22.

## 2022-02-03 NOTE — Progress Notes (Unsigned)
Cardiology Office Note:   Date:  02/03/2022  NAME:  [name]    MRN: [id number] DOB:  August 31, 1981   PCP:  Patient, [name] (Inactive)  Cardiologist:  None  Electrophysiologist:  None   Referring MD: No ref. provider found   No chief complaint on file. ***  History of Present Illness:   [name] is a 41 y.o. female with a hx of MVR/AVR who presents for follow-up.   Problem List Rheumatic heart disease -Mechanical MVR/AVR 2010 -21 mm mechanical AVR (PPM) -33 mm mechanical MVR  Past Medical History: Past Medical History:  Diagnosis Date   Borderline hypertension    H/O aortic valve replacement 12/02/2012   H/O mitral valve replacement 12/02/2012   Long term current use of anticoagulant therapy 12/02/2012   Postoperative atrial fibrillation (HCC)    No recurrence   Rapid palpitations 06/05/2019   Rheumatic valvular disease July 2010; 04/2013   Status post AVR (21 mm Sorin [employer] mechanical) and MVR (32 mm Sorin [employer] Opteform mechanical) for Aortic and mitral valve stenosis with regurgitation;; b) F/u Echo 04/2013: Normal LV size and thickness. EF 60-65%. 21 mm mechanical prosthetic AoV & 33 mm mechanical MV present and functioning normally.  trivial regurgitation. No change since 2013   S/P AVR (aortic valve replacement) & S/P MVR (mitral valve replacement) 2010   RV.   S/P 28mm Sorin [employer] mechanical MVR (mean gradient 3 mmHg).  S/P 83mm Sorin [employer] mechanical AVR which is functioning normally. The mean AVG is 22-36mmHg and there is trivial perivavluar AI.   Seasonal allergies     Past Surgical History: Past Surgical History:  Procedure Laterality Date   AVR and MVR  July 2010   Dr. Barry Dienes: 21 mm Sorin [employer]omed[employer] mechanical aortic valve ; 33 mm Sorin [employer]omed[employer] Optiform mechanical prosthetic mitral valve   CARDIAC CATHETERIZATION  June 2010   Normal coronary arteries   NM MYOVIEW LTD  May 2013   No ischemia or infarction   TEE WITHOUT  CARDIOVERSION N/A 05/21/2015   Procedure: TRANSESOPHAGEAL ECHOCARDIOGRAM (TEE);  Surgeon: Lars Masson, MD;  Location: [facility] ENDOSCOPY;  Service: Cardiovascular;  Laterality: N/A;   TRANSTHORACIC ECHOCARDIOGRAM  May 2013   EF 50-55% with no regional wall motion abnormalities;Well-seated aortic valve with normal gradients (peak 41, mean 23 mmHg); well-seated mitral prosthetic valve no regurgitation normal gradients; mild concentric LVH with impaired relaxation and moderate dilated left atrium.     TRANSTHORACIC ECHOCARDIOGRAM  06/2016   EF 65-70%. No RWMA.  21 mm mechanical aortic valve well-seated. Leaflets open well. Peak/mean gradient is 31/15 mmHg. Mild AI. Normal aortic root. 30 mm mechanical mitral valve well-seated. Leaflets open well. Mild MR. Normal gradients. Dilated left atrium.   TRANSTHORACIC ECHOCARDIOGRAM  October 2020   EF 60-65%.  ? Restrictive filling pressures (does not correlate with mild-mod LA dilation).  Mild-Mod LA dilation. Normal RV.   S/P 74mm Sorin [employer] mechanical MVR (mean gradient 3 mmHg).  S/P 56mm Sorin [employer] mechanical AVR which is functioning normally. The mean AVG is 22-50mmHg and there is trivial perivavluar AI.  vs. Prior Echo -> no significant change in AVG ( )     Current Medications: No outpatient medications have been marked as taking for the 02/04/22 encounter (Appointment) with O'Neal, Ronnald Ramp, MD.     Allergies:    Oxycodone and Ace inhibitors   Social History: Social History   Socioeconomic History   Marital status: Married    Spouse name: Not  on file   Number of children: Not on file   Years of education: Not on file   Highest education level: Not on file  Occupational History   Not on file  Tobacco Use   Smoking status: Never   Smokeless tobacco: Never  Substance and Sexual Activity   Alcohol use: No   Drug use: No   Sexual activity: Not on file  Other Topics Concern   Not on file  Social History Narrative    She is a married mother of 2 who exercises regularly for 30 minutes every other day without difficulty.  Has not taken does not smoke.      Her initial postop course from bowel surgery was complicated by mediastinitis and then a large keloid scar along the midline incision.  She also had postop A. fib that resolved.  She says he finally starting to get "back to herself this past year "   Social Determinants of Health   Financial Resource Strain: Not on file  Food Insecurity: Not on file  Transportation Needs: Not on file  Physical Activity: Not on file  Stress: Not on file  Social Connections: Not on file     Family History: The patient's ***family history includes Heart disease in her mother; Hyperlipidemia in her father and mother.  ROS:   All other ROS reviewed and negative. Pertinent positives noted in the HPI.     EKGs/Labs/Other Studies Reviewed:   The following studies were personally reviewed by me today:  EKG:  EKG is *** ordered today.  The ekg ordered today demonstrates ***, and was personally reviewed by me.   Recent Labs: No results found for requested labs within last 8760 hours.   Recent Lipid Panel    Component Value Date/Time   CHOL 190 06/03/2015 0001   TRIG 86 06/03/2015 0001   HDL 60 06/03/2015 0001   CHOLHDL 3.2 06/03/2015 0001   VLDL 17 06/03/2015 0001   LDLCALC 113 06/03/2015 0001    Physical Exam:   VS:  There were no vitals taken for this visit.   Wt Readings from Last 3 Encounters:  03/11/20 155 lb (70.3 kg)  06/05/19 157 lb 12.8 oz (71.6 kg)  05/10/17 146 lb (66.2 kg)    General: Well nourished, well developed, in no acute distress Head: Atraumatic, normal size  Eyes: PEERLA, EOMI  Neck: Supple, no JVD Endocrine: No thryomegaly Cardiac: Normal S1, S2; RRR; no murmurs, rubs, or gallops Lungs: Clear to auscultation bilaterally, no wheezing, rhonchi or rales  Abd: Soft, nontender, no hepatomegaly  Ext: No edema, pulses 2+ Musculoskeletal:  No deformities, BUE and BLE strength normal and equal Skin: Warm and dry, no rashes   Neuro: Alert and oriented to person, place, time, and situation, CNII-XII grossly intact, no focal deficits  Psych: Normal mood and affect   ASSESSMENT:   [name] is a 41 y.o. female who presents for the following: No diagnosis found.  PLAN:   There are no diagnoses linked to this encounter.  {Are you ordering a CV Procedure (e.g. stress test, cath, DCCV, TEE, etc)?   Press F2        :[id number]}  Disposition: No follow-ups on file.  Medication Adjustments/Labs and Tests Ordered: Current medicines are reviewed at length with the patient today.  Concerns regarding medicines are outlined above.  No orders of the defined types were placed in this encounter.  No orders of the defined types were placed in this encounter.  There are no Patient Instructions on file for this visit.   Time Spent with Patient: I have spent a total of *** minutes with patient reviewing hospital notes, telemetry, EKGs, labs and examining the patient as well as establishing an assessment and plan that was discussed with the patient.  > 50% of time was spent in direct patient care.  Signed, Lenna GilfordWesley T. Flora Lipps'Neal, MD, [facility]  [facility]  [address], [address] [place], [place] [address] [phone]  02/03/2022 7:54 PM

## 2022-02-04 ENCOUNTER — Ambulatory Visit: Payer: BC Managed Care – PPO | Admitting: Cardiovascular Disease

## 2022-02-04 DIAGNOSIS — R072 Precordial pain: Secondary | ICD-10-CM

## 2022-02-04 DIAGNOSIS — Z952 Presence of prosthetic heart valve: Secondary | ICD-10-CM

## 2022-02-10 ENCOUNTER — Encounter: Payer: Self-pay | Admitting: Cardiovascular Disease

## 2022-05-16 ENCOUNTER — Other Ambulatory Visit: Payer: Self-pay

## 2022-05-16 ENCOUNTER — Emergency Department (HOSPITAL_BASED_OUTPATIENT_CLINIC_OR_DEPARTMENT_OTHER): Payer: Self-pay

## 2022-05-16 ENCOUNTER — Emergency Department (HOSPITAL_BASED_OUTPATIENT_CLINIC_OR_DEPARTMENT_OTHER)
Admission: EM | Admit: 2022-05-16 | Discharge: 2022-05-16 | Disposition: A | Discharge status: Home / Self Care | Payer: Self-pay | Attending: Emergency Medicine | Admitting: Emergency Medicine

## 2022-05-16 ENCOUNTER — Encounter (HOSPITAL_BASED_OUTPATIENT_CLINIC_OR_DEPARTMENT_OTHER): Payer: Self-pay | Admitting: Emergency Medicine

## 2022-05-16 DIAGNOSIS — R519 Headache, unspecified: Secondary | ICD-10-CM

## 2022-05-16 DIAGNOSIS — M436 Torticollis: Secondary | ICD-10-CM

## 2022-05-16 LAB — COMPREHENSIVE METABOLIC PANEL
ALT: 14 U/L (ref 0–44)
AST: 20 U/L (ref 15–41)
Albumin: 4.1 g/dL (ref 3.5–5.0)
Alkaline Phosphatase: 47 U/L (ref 38–126)
Anion gap: 6 (ref 5–15)
BUN: 7 mg/dL (ref 6–20)
CO2: 24 mmol/L (ref 22–32)
Calcium: 8.9 mg/dL (ref 8.9–10.3)
Chloride: 105 mmol/L (ref 98–111)
Creatinine, Ser: 0.67 mg/dL (ref 0.44–1.00)
GFR, Estimated: >60 mL/min (ref >60)
Glucose, Bld: 114 mg/dL — ABNORMAL HIGH (ref 70–99)
Potassium: 4 mmol/L (ref 3.5–5.1)
Sodium: 135 mmol/L (ref 135–145)
Total Bilirubin: 0.9 mg/dL (ref 0.3–1.2)
Total Protein: 8 g/dL (ref 6.5–8.1)

## 2022-05-16 LAB — CBC WITH DIFFERENTIAL/PLATELET
Abs Immature Granulocytes: 0.01 10*3/uL (ref 0.00–0.07)
Basophils Absolute: 0 10*3/uL (ref 0.0–0.1)
Basophils Relative: 0 %
Eosinophils Absolute: 0.1 10*3/uL (ref 0.0–0.5)
Eosinophils Relative: 1 %
HCT: 39.9 % (ref 36.0–46.0)
Hemoglobin: 13.3 g/dL (ref 12.0–15.0)
Immature Granulocytes: 0 %
Lymphocytes Relative: 15 %
Lymphs Abs: 0.8 10*3/uL (ref 0.7–4.0)
MCH: 28.9 pg (ref 26.0–34.0)
MCHC: 33.3 g/dL (ref 30.0–36.0)
MCV: 86.6 fL (ref 80.0–100.0)
Monocytes Absolute: 0.3 10*3/uL (ref 0.1–1.0)
Monocytes Relative: 6 %
Neutro Abs: 4.2 10*3/uL (ref 1.7–7.7)
Neutrophils Relative %: 78 %
Platelets: 230 10*3/uL (ref 150–400)
RBC: 4.61 MIL/uL (ref 3.87–5.11)
RDW: 14.6 % (ref 11.5–15.5)
WBC: 5.4 10*3/uL (ref 4.0–10.5)
nRBC: 0 % (ref 0.0–0.2)

## 2022-05-16 LAB — PROTIME-INR
INR: 2.8 — ABNORMAL HIGH (ref 0.8–1.2)
Prothrombin Time: 29.5 seconds — ABNORMAL HIGH (ref 11.4–15.2)

## 2022-05-16 MED ORDER — SODIUM CHLORIDE 0.9 % IV BOLUS
500.0000 mL | Freq: Once | INTRAVENOUS | Status: AC
  Administered 2022-05-16: 500 mL via INTRAVENOUS

## 2022-05-16 MED ORDER — DIAZEPAM 5 MG/ML IJ SOLN
5.0000 mg | Freq: Once | INTRAMUSCULAR | Status: AC
  Administered 2022-05-16: 5 mg via INTRAVENOUS
  Filled 2022-05-16: qty 2

## 2022-05-16 MED ORDER — METHOCARBAMOL 500 MG PO TABS
1000.0000 mg | ORAL_TABLET | Freq: Three times a day (TID) | ORAL | 0 refills | Status: DC | PRN
Start: 2022-05-16 — End: 2022-08-31

## 2022-05-16 MED ORDER — DICLOFENAC SODIUM 1 % EX GEL
2.0000 g | Freq: Four times a day (QID) | CUTANEOUS | 0 refills | Status: DC | PRN
Start: 2022-05-16 — End: 2022-08-31

## 2022-05-16 NOTE — ED Notes (Signed)
Pt discharged to home. Discharge instructions have been discussed with patient and/or family members. Pt verbally acknowledges understanding d/c instructions, and endorses comprehension to checkout at registration before leaving.  °

## 2022-05-16 NOTE — ED Provider Notes (Signed)
Emergency Department Provider Note   I have reviewed the triage vital signs and the nursing notes.   HISTORY  Chief Complaint Neck Pain   HPI [name] is a 41 y.o. female with past history of valve replacement on Coumadin presents to the emergency department with headache, neck discomfort over the past several days.  She is currently having a shingles outbreak in the right abdomen.  Those symptoms are gradually improving but she continues to have the rash and some discomfort.  She developed headache and posterior neck pain which feel more on the right side.  Her pain is worse with turning her head to the right.  She denies any injury.  No fevers.  No confusion or unusual sleepiness.  She does have some pain running down the back and into the right leg.  No numbness or unilateral weakness.  No vision changes.  Does have history of migraine but states this headache does not feel typical for that.  No head injuries.    Past Medical History:  Diagnosis Date   Borderline hypertension    H/O aortic valve replacement 12/02/2012   H/O mitral valve replacement 12/02/2012   [name] term current use of anticoagulant therapy 12/02/2012   Postoperative atrial fibrillation (HCC)    No recurrence   Rapid palpitations 06/05/2019   Rheumatic valvular disease July 2010; 04/2013   Status post AVR (21 mm Sorin [employer] mechanical) and MVR (32 mm Sorin [employer] Opteform mechanical) for Aortic and mitral valve stenosis with regurgitation;; b) F/u Echo 04/2013: Normal LV size and thickness. EF 60-65%. 21 mm mechanical prosthetic AoV & 33 mm mechanical MV present and functioning normally.  trivial regurgitation. No change since 2013   S/P AVR (aortic valve replacement) & S/P MVR (mitral valve replacement) 2010   RV.   S/P 48mm Sorin [employer]omed[employer] mechanical MVR (mean gradient 3 mmHg).  S/P 20mm Sorin [employer] mechanical AVR which is functioning normally. The mean AVG is 22-22mmHg and there is trivial  perivavluar AI.   Seasonal allergies     Review of Systems  Constitutional: No fever/chills Eyes: No visual changes. Cardiovascular: Denies chest pain. Respiratory: Denies shortness of breath. Gastrointestinal: No abdominal pain.   Genitourinary: Negative for dysuria. Musculoskeletal: Positive neck and back pain.  Skin: Negative for rash. Neurological: Negative for focal weakness or numbness. Positive HA.   ____________________________________________   PHYSICAL EXAM:  VITAL SIGNS: ED Triage Vitals  Enc Vitals Group     BP 05/16/22 0940 (!) 143/92     Pulse Rate 05/16/22 0940 80     Resp 05/16/22 0940 18     Temp 05/16/22 0940 97.8 F (36.6 C)     Temp Source 05/16/22 0940 Oral     SpO2 05/16/22 0940 100 %     Weight 05/16/22 0942 156 lb (70.8 kg)     Height 05/16/22 0942 5' 3.5" (1.613 m)   Constitutional: Alert and oriented. Well appearing and in no acute distress. Eyes: Conjunctivae are normal.  Head: Atraumatic. Nose: No congestion/rhinnorhea. Mouth/Throat: Mucous membranes are moist.  Oropharynx non-erythematous. Neck: No stridor.  No meningeal signs.  Discomfort with turning right but no clear meningismus.  Cardiovascular: Normal rate, regular rhythm. Good peripheral circulation. Grossly normal heart sounds.   Respiratory: Normal respiratory effort.  No retractions. Lungs CTAB. Gastrointestinal: Soft and nontender. No distention.  Musculoskeletal: No lower extremity tenderness nor edema. No gross deformities of extremities. Neurologic:  Normal speech and language. No gross focal neurologic deficits are appreciated.  Skin:  Skin is warm and dry.  Blistering rash in dermatomal distribution to the right upper abdomen and flank beginning to scab over.   ____________________________________________   LABS (all labs ordered are listed, but only abnormal results are displayed)  Labs Reviewed  COMPREHENSIVE METABOLIC PANEL - Abnormal; Notable for the following  components:      Result Value   Glucose, Bld 114 (*)    All other components within normal limits  PROTIME-INR - Abnormal; Notable for the following components:   Prothrombin Time 29.5 (*)    INR 2.8 (*)    All other components within normal limits  CBC WITH DIFFERENTIAL/PLATELET   ____________________________________________  RADIOLOGY  CT Head Wo Contrast  Result Date: 05/16/2022 CLINICAL DATA:  Headache and posterior neck pain. EXAM: CT HEAD WITHOUT CONTRAST TECHNIQUE: Contiguous axial images were obtained from the base of the skull through the vertex without intravenous contrast. RADIATION DOSE REDUCTION: This exam was performed according to the departmental dose-optimization program which includes automated exposure control, adjustment of the mA and/or kV according to patient size and/or use of iterative reconstruction technique. COMPARISON:  None Available. FINDINGS: Brain: There is no evidence of an acute infarct, intracranial hemorrhage, mass, midline shift, or extra-axial fluid collection. The ventricles and sulci are normal. Vascular: No hyperdense vessel. Skull: No fracture or suspicious osseous lesion. Sinuses/Orbits: Visualized paranasal sinuses are clear. At most trace bilateral mastoid fluid. Unremarkable orbits. Other: None. IMPRESSION: Negative head CT. Electronically Signed   By: Sebastian Ache M.D.   On: 05/16/2022 11:44    ____________________________________________   PROCEDURES  Procedure(s) performed:   Procedures  None  ____________________________________________   INITIAL IMPRESSION / ASSESSMENT AND PLAN / ED COURSE  Pertinent labs & imaging results that were available during my care of the patient were reviewed by me and considered in my medical decision making (see chart for details).   This patient is Presenting for Evaluation of HA, which does require a range of treatment options, and is a complaint that involves a high risk of morbidity and  mortality.  The Differential Diagnoses includes subdural hematoma, epidural hematoma, acute concussion, traumatic subarachnoid hemorrhage, cerebral contusions, etc.   Critical Interventions-    Medications  sodium chloride 0.9 % bolus 500 mL (0 mLs Intravenous Stopped 05/16/22 1130)  diazepam (VALIUM) injection 5 mg (5 mg Intravenous Given 05/16/22 1030)    Reassessment after intervention: Symptoms improved. Neck ROM and pain significantly improved.    I decided to review pertinent External Data, and in summary patient seen by her family medicine physician on 8/21.  She was diagnosed with zoster and started on Valtrex along with Vicodin.   Clinical Laboratory Tests Ordered, included CBC without leukocytosis or anemia.  Radiologic Tests: Ct head independently evaluated and agree with radiology interpretation.  Cardiac Monitor Tracing which shows NSR.   Social Determinants of Health Risk no smoking history.    Medical Decision Making: Summary:  Patient presents emergency department valuation of headache and neck discomfort.  She is afebrile and not had fever at home.  Headache is not sudden onset/maximal intensity although is bothering her significantly.  Considered disseminated zoster/CNS infection/encephalitis but clinically patient does not seem consistent with this.  She seems to have more of a torticollis/MSK etiology for her neck discomfort.  I do plan to add on basic labs and treat symptoms with IV fluids and Valium.   Reevaluation with update and discussion with patient.  CT imaging reassuring.  Lab work is within normal  limits.  She is improved after symptom management here.  She remains afebrile.  Do not see a clear indication for lumbar puncture but also considering patient is anticoagulated for mechanical valve.   Considered admission symptoms improving here and lab work/imaging reassuring.  Strict ED return precautions provided.   Disposition:  discharge  ____________________________________________  FINAL CLINICAL IMPRESSION(S) / ED DIAGNOSES  Final diagnoses:  Torticollis  Acute nonintractable headache, unspecified headache type     NEW OUTPATIENT MEDICATIONS STARTED DURING THIS VISIT:  New Prescriptions   DICLOFENAC SODIUM (VOLTAREN) 1 % GEL    Apply 2 g topically 4 (four) times daily as needed.   METHOCARBAMOL (ROBAXIN) 500 MG TABLET    Take 2 tablets (1,000 mg total) by mouth every 8 (eight) hours as needed for muscle spasms.    Note:  This document was prepared using Dragon voice recognition software and may include unintentional dictation errors.  Alona Bene, MD, [facility]    Rosann Gorum, Arlyss Repress, MD 05/16/22 [phone]

## 2022-05-16 NOTE — Discharge Instructions (Signed)
You are seen emergency room today with headache and neck discomfort.  Your lab work here is reassuring and I am treating you with medication to help with your symptoms.  If you develop fever you need to return to medical treatment immediately either at this or your closest emergency department.

## 2022-05-16 NOTE — ED Notes (Signed)
Pt transported to CT ?

## 2022-05-16 NOTE — ED Triage Notes (Signed)
Pt arrives pov, c/o HA and posterior neck pain with decreased ROM and back pain radiating into RT leg x 4 days, worsens when lying down. Recent dx with shingles, rash on RUQ. Pt denies injury. Pt endorses blood thinners. Took hydrocodone at 0800 with little relief.

## 2022-05-25 ENCOUNTER — Ambulatory Visit: Payer: BC Managed Care – PPO | Admitting: Cardiology

## 2022-06-29 ENCOUNTER — Other Ambulatory Visit: Payer: Self-pay | Admitting: Obstetrics and Gynecology

## 2022-06-29 DIAGNOSIS — Z1231 Encounter for screening mammogram for malignant neoplasm of breast: Secondary | ICD-10-CM

## 2022-08-17 ENCOUNTER — Ambulatory Visit
Admission: RE | Admit: 2022-08-17 | Discharge: 2022-08-17 | Disposition: A | Discharge status: Home / Self Care | Payer: Self-pay | Source: Ambulatory Visit | Attending: Obstetrics and Gynecology | Admitting: Obstetrics and Gynecology

## 2022-08-17 DIAGNOSIS — Z1231 Encounter for screening mammogram for malignant neoplasm of breast: Secondary | ICD-10-CM

## 2022-08-31 ENCOUNTER — Ambulatory Visit: Payer: 59 | Attending: Cardiology | Admitting: Cardiology

## 2022-08-31 ENCOUNTER — Encounter: Payer: Self-pay | Admitting: Cardiology

## 2022-08-31 VITALS — BP 106/74 | HR 85 | Ht 64.0 in | Wt 154.0 lb

## 2022-08-31 DIAGNOSIS — Z952 Presence of prosthetic heart valve: Secondary | ICD-10-CM | POA: Diagnosis not present

## 2022-08-31 DIAGNOSIS — Z7901 Long term (current) use of anticoagulants: Secondary | ICD-10-CM

## 2022-08-31 DIAGNOSIS — I10 Essential (primary) hypertension: Secondary | ICD-10-CM | POA: Diagnosis not present

## 2022-08-31 DIAGNOSIS — R002 Palpitations: Secondary | ICD-10-CM | POA: Diagnosis not present

## 2022-08-31 MED ORDER — AMOXICILLIN 500 MG PO TABS: ORAL_TABLET | ORAL | 4 refills | Status: DC

## 2022-08-31 MED ORDER — PROPRANOLOL HCL 10 MG PO TABS: 10.0000 mg | ORAL_TABLET | Freq: Every day | ORAL | 3 refills | Status: DC

## 2022-08-31 NOTE — Progress Notes (Signed)
Primary Care Provider: Patient, [name] [facility] Cardiologist: Bryan Lemma, MD Electrophysiologist: None  Clinic Note: Chief Complaint  Patient presents with   Follow-up    2 and half years on last visit.  Had been doing well up until May when she had a few episodes of chest pain.  No longer having pain   Cardiac Valve Problem    Status post AVR/MVR in 2010.-She checks her INR's at home.  Has not had issues.  No CHF symptoms.    ===================================  ASSESSMENT/PLAN   Problem List Items Addressed This Visit       Cardiology Problems   Essential hypertension - Primary (Chronic)    Blood pressures look great.  I actually do not think that she has a true diagnosis of hypertension.  She is on PRN propranolol only.      Relevant Medications   propranolol (INDERAL) 10 MG tablet     Other   H/O aortic valve replacement (Chronic)    Last evaluated in 2020.  He is due for echo now. She has a soft systolic murmur but crisp valve closure.  Plan: Check 2D echo to ensure stable gradient.  If there is concern of some chest pain a while back but she says that her INR was never below 2.  Continue warfarin-she checks her home INR.      Relevant Orders   ECHOCARDIOGRAM COMPLETE (Completed)   Rapid palpitations (Chronic)    She has symptomatic palpitation episodes.  We have never really determine what it truly is.  Does not seem to be long-lasting enough to be A-fib however that is a concern.  She is on PRN beta-blocker which tends to help the palpitation spells, but is relatively symptomatic when she has prolonged spells.  They are not happening frequently enough in order to feel a capture anything on monitor.  We talked about maybe using home monitor such as [employer].  She will think about it.  The fact that not prolonged that she is already on warfarin she is not scared of atrial flutter or atrial fibrillation, she really feels more like brief bursts  of SVT.  For now we will continue to use as needed beta-blocker, low threshold to put her on standing dose because she has had issues with hypotension.      H/O mitral valve replacement (Chronic)    Similarly, status post mitral replacement with mechanical valve.  Has been on warfarin, stable, checks her home INR.  INR has been stable.  Doing well now, but has chest pain when she is tachycardic. ->  She uses as needed propranolol may be to the times a month, however the most recent episode was more dramatic and probably required or then an additional 1 dose to break.  Low threshold to consider keeping low-dose beta-blocker on board.  Due for follow-up echocardiogram prior to this calendar year being complete      Relevant Orders   ECHOCARDIOGRAM COMPLETE (Completed)   Long term current use of anticoagulant therapy (Chronic)    Both aortic and mitral valve replacements are mechanical.  Long-term anticoagulation with warfarin.  She follows her own INR.  Worse in conjunction with the A-fib clinic.  Has not had any significantly reduced INR levels.  Lowest was 2 back in May.      Relevant Orders   ECHOCARDIOGRAM COMPLETE (Completed)   ===================================  HPI:    [name] is a 41 y.o. female with a PMH notable  for history of AVR/MVR in 2010 (Rheumatic Heart Disease with severe CHF-resolved after surgery) who presents today for 54-month follow-up.  [name] was last seen on 03/11/2020.  Her last echo at that time was August 2020 showing EF 60 to 65%.  Suggested restrictive filling pressures.  30 mm mechanical MV was functioning well.  MG 3 million mercury.  21 mm mechanical AoV also well-functioning.  MG 22-27 mm agree with trivial AI.  No change.  She was doing well with no major issues.  No longer having any palpitation spells like she had had before.  She maybe took 1 or 2 doses of PRN propranolol a month and usually just once but occasionally and take it  twice.  She has a chest discomfort when her heart rate goes fast.  Had any issues with INR checks at home. => Recommendation was 1 year follow-up, however 1 visit was canceled due to COVID issues, and then she was lost to follow-up.  She called in on 02/02/2022 to report episode of chest tightness-relieved by Inderal.  Her INR was 2 so she took a little extra warfarin.  However the next day she had recurrent chest pain noted with walking.   She felt that is related to her INR being low.-Triage nurse recommended taking additional Inderal and going to ER.  (Note that she realized that exertional dyspnea and chest pain can occur with palpation if the INR is low because of thrombus. In the Urgent Care she had chest x-ray and EKG that were both normal.  She was given come in on 02/04/2022, but canceled because she is feeling better.  Recent Hospitalizations:  She went to an urgent care on 02/03/2022 for chest pain, chest x-ray and EKG that was normal.  Chest pain-free on discharge ER visit 05/16/2022 for torticollis-headache and posterior neck pain worse with turning head to the right.  Had pain rating down the back into the right leg.  Was in sinus rhythm.  Eventually diagnosed as SHINGLES => she had pretty significant neck and facial neuralgia.  Reviewed  CV studies:    The following studies were reviewed today: (if available, images/films reviewed: From Epic Chart or Care Everywhere) Due for 2D echo.:  Interval History:   [name] presents here today overall doing well.  She has not had any further episodes of the chest discomfort that she had back in May.  She is pretty sure that had been with her palpitations it just was longer than usual.  She has not had to take more than 2 propranolol before.  She was concerned that it may have been related to her INR being low and having thrombus, but follow-up INRs have been normal.  No subtherapeutic levels.  She has not had any PND, orthopnea or edema.  No  signs symptoms of arrhythmia such as rapid irregular heartbeats or palpitations.  No further resting or exertional chest pain or pressure.  No syncope or near syncope.  No TIA Promus venous.  No melena, hematochezia hematuria epistaxis or significant bruising.  REVIEWED OF SYSTEMS   Review of Systems  Constitutional:  Negative for malaise/fatigue and weight loss.  HENT:  Negative for congestion and nosebleeds.   Respiratory:  Negative for cough and shortness of breath.   Gastrointestinal:  Negative for abdominal pain, blood in stool and melena.  Genitourinary:  Negative for hematuria.  Musculoskeletal:  Negative for joint pain.  Neurological:  Negative for dizziness and focal weakness.  Endo/Heme/Allergies:  Does  not bruise/bleed easily.  Psychiatric/Behavioral: Negative.     I have reviewed and (if needed) personally updated the patient's problem list, medications, allergies, past medical and surgical history, social and family history.   PAST MEDICAL HISTORY   Past Medical History:  Diagnosis Date   Borderline hypertension    H/O aortic valve replacement 12/02/2012   H/O mitral valve replacement 12/02/2012   Long term current use of anticoagulant therapy 12/02/2012   Postoperative atrial fibrillation (HCC)    No recurrence   Rapid palpitations 06/05/2019   Rheumatic valvular disease July 2010; 04/2013   Status post AVR (21 mm Sorin [employer] mechanical) and MVR (32 mm Sorin [employer] Opteform mechanical) for Aortic and mitral valve stenosis with regurgitation;; b) F/u Echo 04/2013: Normal LV size and thickness. EF 60-65%. 21 mm mechanical prosthetic AoV & 33 mm mechanical MV present and functioning normally.  trivial regurgitation. No change since 2013   S/P AVR (aortic valve replacement) & S/P MVR (mitral valve replacement) 2010   RV.   S/P 45mm Sorin [employer] mechanical MVR (mean gradient 3 mmHg).  S/P 41mm Sorin [employer] mechanical AVR which is functioning normally. The  mean AVG is 22-36mmHg and there is trivial perivavluar AI.   Seasonal allergies     PAST SURGICAL HISTORY   Past Surgical History:  Procedure Laterality Date   AVR and MVR  July 2010   Dr. Barry Dienes: 21 mm Sorin [employer]omed[employer] mechanical aortic valve ; 33 mm Sorin [employer]omed[employer] Optiform mechanical prosthetic mitral valve   CARDIAC CATHETERIZATION  June 2010   Normal coronary arteries   NM MYOVIEW LTD  May 2013   No ischemia or infarction   TEE WITHOUT CARDIOVERSION N/A 05/21/2015   Procedure: TRANSESOPHAGEAL ECHOCARDIOGRAM (TEE);  Surgeon: Lars Masson, MD;  Location: [facility] ENDOSCOPY;  Service: Cardiovascular;  Laterality: N/A;   TRANSTHORACIC ECHOCARDIOGRAM  May 2013   EF 50-55% with no regional wall motion abnormalities;Well-seated aortic valve with normal gradients (peak 41, mean 23 mmHg); well-seated mitral prosthetic valve no regurgitation normal gradients; mild concentric LVH with impaired relaxation and moderate dilated left atrium.     TRANSTHORACIC ECHOCARDIOGRAM  06/2016   EF 65-70%. No RWMA.  21 mm mechanical aortic valve well-seated. Leaflets open well. Peak/mean gradient is 31/15 mmHg. Mild AI. Normal aortic root. 30 mm mechanical mitral valve well-seated. Leaflets open well. Mild MR. Normal gradients. Dilated left atrium.   TRANSTHORACIC ECHOCARDIOGRAM  October 2020   EF 60-65%.  ? Restrictive filling pressures (does not correlate with mild-mod LA dilation).  Mild-Mod LA dilation. Normal RV.   S/P 56mm Sorin [employer] mechanical MVR (mean gradient 3 mmHg).  S/P 56mm Sorin [employer] mechanical AVR which is functioning normally. The mean AVG is 22-53mmHg and there is trivial perivavluar AI.  vs. Prior Echo -> no significant change in AVG ( )     Immunization History  Administered Date(s) Administered   PFIZER Comirnaty(Gray Top)Covid-19 Tri-Sucrose Vaccine 11/27/2019, 12/22/2019, 01/27/2021    MEDICATIONS/ALLERGIES   Current Meds  Medication Sig   Probiotic Product  (PROBIOTIC ADVANCED PO) Take by mouth.   triamcinolone ointment (KENALOG) 0.1 % Apply topically 2 (two) times daily.   warfarin (COUMADIN) 7.5 MG tablet TAKE 1 TABLET(7.5 MG) BY MOUTH DAILY   [DISCONTINUED] amoxicillin (AMOXIL) 500 MG tablet TAKE 4 TABLETS BY MOUTH AS DIRECTED PRIOR TO DENTAL PROCEDURES   [DISCONTINUED] diclofenac Sodium (VOLTAREN) 1 % GEL Apply 2 g topically 4 (four) times daily as needed.   [DISCONTINUED] methocarbamol (ROBAXIN) 500 MG tablet Take 2  tablets (1,000 mg total) by mouth every 8 (eight) hours as needed for muscle spasms.   [DISCONTINUED] propranolol (INDERAL) 10 MG tablet Take 10 mg by mouth daily. May take an additional 10 mg if needed    Allergies  Allergen Reactions   Oxycodone Other (See Comments)    hallucinate   Ace Inhibitors Cough    SOCIAL HISTORY/FAMILY HISTORY   Reviewed in Epic:  Pertinent findings:  Social History   Tobacco Use   Smoking status: Never   Smokeless tobacco: Never  Substance Use Topics   Alcohol use: No   Drug use: No   Social History   Social History Narrative   She is a married mother of 2 who exercises regularly for 30 minutes every other day without difficulty.  Has not taken does not smoke.      Her initial postop course from bowel surgery was complicated by mediastinitis and then a large keloid scar along the midline incision.  She also had postop A. fib that resolved.  She says he finally starting to get "back to herself this past year "    OBJCTIVE -PE, EKG, labs   Wt Readings from Last 3 Encounters:  08/31/22 154 lb (69.9 kg)  05/16/22 156 lb (70.8 kg)  03/11/20 155 lb (70.3 kg)    Physical Exam: BP 106/74 (BP Location: Left Arm, Patient Position: Sitting, Cuff Size: Normal)   Pulse 85   Ht 5\' 4"  (1.626 m)   Wt 154 lb (69.9 kg)   BMI 26.43 kg/m  Physical Exam Vitals reviewed.  Constitutional:      Appearance: Normal appearance. She is normal weight. She is not ill-appearing (Well-nourished,  well-groomed.) or toxic-appearing.  HENT:     Head: Normocephalic and atraumatic.  Neck:     Vascular: No carotid bruit or JVD.  Cardiovascular:     Rate and Rhythm: Normal rate and regular rhythm. No extrasystoles are present.    Chest Wall: PMI is not displaced.     Pulses: Normal pulses.     Heart sounds: Murmur (1/6 SEM noted RUSB.) heard.     No friction rub. No gallop.     Comments: Crisp metallic S1 and S2 Pulmonary:     Effort: Pulmonary effort is normal. No respiratory distress.     Breath sounds: Normal breath sounds. No wheezing, rhonchi or rales.  Chest:     Chest wall: No tenderness.  Musculoskeletal:        General: No swelling. Normal range of motion.     Cervical back: Normal range of motion and neck supple.  Skin:    General: Skin is warm and dry.     Coloration: Skin is not jaundiced.  Neurological:     General: No focal deficit present.     Mental Status: She is alert and oriented to person, place, and time.     Gait: Gait normal.  Psychiatric:        Mood and Affect: Mood normal.        Behavior: Behavior normal.        Thought Content: Thought content normal.        Judgment: Judgment normal.      Adult ECG Report  Rate: 85 ;  Rhythm: normal sinus rhythm and left atrial enlargement. ;  T wave maladies noted in inferior and anterolateral leads-cannot exclude ischemia, however there eccentric T wave versions and mild depression more consistent with repolarization changes.  They are new.  Narrative Interpretation: New  T wave inversions in inferior and anterolateral leads concerning for LVH repolarization changes.  Recent Labs:   Last recorded INR on 06/11/2022 was 2.8. Lab Results  Component Value Date   CHOL 190 06/03/2015   HDL 60 06/03/2015   LDLCALC 113 06/03/2015   TRIG 86 06/03/2015   CHOLHDL 3.2 06/03/2015   Lab Results  Component Value Date   CREATININE 0.67 05/16/2022   BUN 7 05/16/2022   NA 135 05/16/2022   K 4.0 05/16/2022   CL 105  05/16/2022   CO2 24 05/16/2022      Latest Ref Rng & Units 05/16/2022   10:08 AM 04/29/2015    4:52 PM 04/04/2009    3:35 AM  CBC  WBC 4.0 - 10.5 K/uL 5.4  5.5  7.8   Hemoglobin 12.0 - 15.0 g/dL 11.9  14.7  82.9   Hematocrit 36.0 - 46.0 % 39.9  39.2  35.7   Platelets 150 - 400 K/uL 230  182.0  289     Lab Results  Component Value Date   HGBA1C  03/26/2009    5.3 (NOTE) The ADA recommends the following therapeutic goal for glycemic control related to Hgb A1c measurement: Goal of therapy: <6.5 Hgb A1c  Reference: American Diabetes Association: Clinical Practice Recommendations 2010, Diabetes Care, 2010, 33: (Suppl  1).   Lab Results  Component Value Date   TSH 1.18 04/29/2015    ================================================== I spent a total of 26 minutes with the patient spent in direct patient consultation.  Additional time spent with chart review  / charting (studies, outside notes, etc): 20 min Total Time: 46 min  Current medicines are reviewed at length with the patient today.  (+/- concerns) none  Notice: This dictation was prepared with Dragon dictation along with smart phrase technology. Any transcriptional errors that result from this process are unintentional and may not be corrected upon review.  Studies Ordered:   Orders Placed This Encounter  Procedures   ECHOCARDIOGRAM COMPLETE   Meds ordered this encounter  Medications   propranolol (INDERAL) 10 MG tablet    Sig: Take 1 tablet (10 mg total) by mouth daily. May take an additional 10 mg if needed    Dispense:  110 tablet    Refill:  3   amoxicillin (AMOXIL) 500 MG tablet    Sig: TAKE 4 TABLETS BY MOUTH AS DIRECTED PRIOR TO DENTAL PROCEDURES    Dispense:  4 tablet    Refill:  4    Patient Instructions / Medication Changes & Studies & Tests Ordered   Patient Instructions  Medication Instructions:  Not needed  *If you need a refill on your cardiac medications before your next appointment, please call  your pharmacy*   Lab Work: Not needed If you have labs (blood work) drawn today and your tests are completely normal, you will receive your results only by: MyChart Message (if you have MyChart) OR A paper copy in the mail If you have any lab test that is abnormal or we need to change your treatment, we will call you to review the results.   Testing/Procedures: Will be schedule at [address] Your physician has requested that you have an echocardiogram. Echocardiography is a painless test that uses sound waves to create images of your heart. It provides your doctor with information about the size and shape of your heart and how well your heart's chambers and valves are working. This procedure takes approximately one hour. There are  no restrictions for this procedure. Please do NOT wear cologne, perfume, aftershave, or lotions (deodorant is allowed). Please arrive 15 minutes prior to your appointment time.   Follow-Up: At [facility], you and your health needs are our priority.  As part of our continuing mission to provide you with exceptional heart care, we have created designated Provider Care Teams.  These Care Teams include your primary Cardiologist (physician) and Advanced Practice Providers (APPs -  Physician Assistants and Nurse Practitioners) who all work together to provide you with the care you need, when you need it.     Your next appointment:   12 month(s)  The format for your next appointment:   In Person  Provider:   Bryan Lemmaavid Laquita Harlan, MD    Other Instructions      Marykay Lexavid W. Samul Mcinroy, MD, MS Bryan Lemmaavid Delmar Arriaga, M.D., M.S. Interventional Cardiologist  [facility]  Pager # [phone] Phone # [phone] [address] [place], [place] [address]   Thank you for choosing [facility] at [facility]!!

## 2022-08-31 NOTE — Patient Instructions (Addendum)
Medication Instructions:  Not needed  *If you need a refill on your cardiac medications before your next appointment, please call your pharmacy*   Lab Work: Not needed If you have labs (blood work) drawn today and your tests are completely normal, you will receive your results only by: MyChart Message (if you have MyChart) OR A paper copy in the mail If you have any lab test that is abnormal or we need to change your treatment, we will call you to review the results.   Testing/Procedures: Will be schedule at [address] Your physician has requested that you have an echocardiogram. Echocardiography is a painless test that uses sound waves to create images of your heart. It provides your doctor with information about the size and shape of your heart and how well your heart's chambers and valves are working. This procedure takes approximately one hour. There are no restrictions for this procedure. Please do NOT wear cologne, perfume, aftershave, or lotions (deodorant is allowed). Please arrive 15 minutes prior to your appointment time.   Follow-Up: At [facility], you and your health needs are our priority.  As part of our continuing mission to provide you with exceptional heart care, we have created designated Provider Care Teams.  These Care Teams include your primary Cardiologist (physician) and Advanced Practice Providers (APPs -  Physician Assistants and Nurse Practitioners) who all work together to provide you with the care you need, when you need it.     Your next appointment:   12 month(s)  The format for your next appointment:   In Person  Provider:   Bryan Lemma, MD    Other Instructions

## 2022-09-01 ENCOUNTER — Other Ambulatory Visit (INDEPENDENT_AMBULATORY_CARE_PROVIDER_SITE_OTHER): Payer: 59 | Admitting: *Deleted

## 2022-09-01 ENCOUNTER — Telehealth: Payer: Self-pay

## 2022-09-01 ENCOUNTER — Ambulatory Visit (HOSPITAL_COMMUNITY): Payer: 59 | Attending: Cardiovascular Disease

## 2022-09-01 DIAGNOSIS — Z7901 Long term (current) use of anticoagulants: Secondary | ICD-10-CM | POA: Diagnosis not present

## 2022-09-01 DIAGNOSIS — I10 Essential (primary) hypertension: Secondary | ICD-10-CM

## 2022-09-01 DIAGNOSIS — R002 Palpitations: Secondary | ICD-10-CM | POA: Diagnosis not present

## 2022-09-01 DIAGNOSIS — Z952 Presence of prosthetic heart valve: Secondary | ICD-10-CM | POA: Insufficient documentation

## 2022-09-01 LAB — ECHOCARDIOGRAM COMPLETE
AR max vel: 0.65 cm2
AV Area VTI: 0.77 cm2
AV Area mean vel: 0.73 cm2
AV Mean grad: 22 mmHg
AV Peak grad: 37.7 mmHg
Ao pk vel: 3.07 m/s
Area-P 1/2: 3.21 cm2
MV VTI: 1.73 cm2
S' Lateral: 3.1 cm

## 2022-09-01 NOTE — Telephone Encounter (Signed)
I called patient because she was referred to [place] from Dr Herbie Baltimore to start managing her Coumadin.  The patient told me that she spoke with Belenda Cruise long ago and she would assist with dosing.    I saw in the chart that this was done in 2019.  The patient said that she has been self managing since then, but could not tell me where she gets supplies from.     I reached out to Laural Golden to call and discuss options with her.

## 2022-09-05 ENCOUNTER — Encounter: Payer: Self-pay | Admitting: Cardiology

## 2022-09-05 NOTE — Assessment & Plan Note (Signed)
Both aortic and mitral valve replacements are mechanical.  Long-term anticoagulation with warfarin.  She follows her own INR.  Worse in conjunction with the A-fib clinic.  Has not had any significantly reduced INR levels.  Lowest was 2 back in May.

## 2022-09-05 NOTE — Assessment & Plan Note (Signed)
Similarly, status post mitral replacement with mechanical valve.  Has been on warfarin, stable, checks her home INR.  INR has been stable.  Doing well now, but has chest pain when she is tachycardic. ->  She uses as needed propranolol may be to the times a month, however the most recent episode was more dramatic and probably required or then an additional 1 dose to break.  Low threshold to consider keeping low-dose beta-blocker on board.  Due for follow-up echocardiogram prior to this calendar year being complete

## 2022-09-05 NOTE — Assessment & Plan Note (Signed)
Last evaluated in 2020.  He is due for echo now. She has a soft systolic murmur but crisp valve closure.  Plan: Check 2D echo to ensure stable gradient.  If there is concern of some chest pain a while back but she says that her INR was never below 2.  Continue warfarin-she checks her home INR.

## 2022-09-05 NOTE — Assessment & Plan Note (Addendum)
Blood pressures look great.  I actually do not think that she has a true diagnosis of hypertension.  She is on PRN propranolol only.

## 2022-09-05 NOTE — Assessment & Plan Note (Signed)
She has symptomatic palpitation episodes.  We have never really determine what it truly is.  Does not seem to be long-lasting enough to be A-fib however that is a concern.  She is on PRN beta-blocker which tends to help the palpitation spells, but is relatively symptomatic when she has prolonged spells.  They are not happening frequently enough in order to feel a capture anything on monitor.  We talked about maybe using home monitor such as [employer].  She will think about it.  The fact that not prolonged that she is already on warfarin she is not scared of atrial flutter or atrial fibrillation, she really feels more like brief bursts of SVT.  For now we will continue to use as needed beta-blocker, low threshold to put her on standing dose because she has had issues with hypotension.

## 2022-09-20 ENCOUNTER — Other Ambulatory Visit: Payer: Self-pay | Admitting: Cardiology

## 2022-09-21 NOTE — Telephone Encounter (Signed)
Pt has not been seen in the Anticoagulation Clinic. Will not send refill at this time

## 2022-09-22 NOTE — Telephone Encounter (Signed)
Patient scheduled for new coumadin appt next week

## 2022-10-06 ENCOUNTER — Ambulatory Visit: Payer: 59 | Attending: Cardiology | Admitting: *Deleted

## 2022-10-06 DIAGNOSIS — Z5181 Encounter for therapeutic drug level monitoring: Secondary | ICD-10-CM | POA: Diagnosis not present

## 2022-10-06 DIAGNOSIS — Z952 Presence of prosthetic heart valve: Secondary | ICD-10-CM | POA: Diagnosis not present

## 2022-10-06 LAB — POCT INR: INR: 3.4 — AB (ref 2.0–3.0)

## 2022-10-06 MED ORDER — WARFARIN SODIUM 7.5 MG PO TABS: ORAL_TABLET | ORAL | 0 refills | Status: DC

## 2022-10-06 NOTE — Patient Instructions (Addendum)
A full discussion of the nature of anticoagulants has been carried out.  A benefit risk analysis has been presented to the patient, so that they understand the justification for choosing anticoagulation at this time. The need for frequent and regular monitoring, precise dosage adjustment and compliance is stressed.  Side effects of potential bleeding are discussed.  The patient should avoid any OTC items containing aspirin or ibuprofen, and should avoid great swings in general diet.  Avoid alcohol consumption.  Call if any signs of abnormal bleeding.      Description   Continue taking warfarin alternating dose of 7.5mg  and 11.25mg  daily. Recheck INR in 2 weeks-Self Tester. Call the Anticoagulation Clinic at [phone] with any new meds or upcoming procedures.

## 2022-10-20 ENCOUNTER — Ambulatory Visit (INDEPENDENT_AMBULATORY_CARE_PROVIDER_SITE_OTHER): Payer: 59 | Admitting: *Deleted

## 2022-10-20 DIAGNOSIS — Z5181 Encounter for therapeutic drug level monitoring: Secondary | ICD-10-CM | POA: Diagnosis not present

## 2022-10-20 DIAGNOSIS — Z952 Presence of prosthetic heart valve: Secondary | ICD-10-CM

## 2022-10-20 LAB — POCT INR: INR: 2.7 (ref 2.0–3.0)

## 2022-10-20 NOTE — Patient Instructions (Signed)
Description   Spoke with pt and advised to continue taking warfarin alternating dose of 7.5mg  and 11.25mg  daily. Recheck INR in 2 weeks-Self Tester. Call the Anticoagulation Clinic at [phone] with any new meds or upcoming procedures.

## 2022-11-03 ENCOUNTER — Ambulatory Visit (INDEPENDENT_AMBULATORY_CARE_PROVIDER_SITE_OTHER): Payer: 59 | Admitting: *Deleted

## 2022-11-03 DIAGNOSIS — Z5181 Encounter for therapeutic drug level monitoring: Secondary | ICD-10-CM

## 2022-11-03 DIAGNOSIS — Z952 Presence of prosthetic heart valve: Secondary | ICD-10-CM | POA: Diagnosis not present

## 2022-11-03 LAB — POCT INR: INR: 3.4 — AB (ref 2.0–3.0)

## 2022-11-03 MED ORDER — WARFARIN SODIUM 7.5 MG PO TABS: ORAL_TABLET | ORAL | 1 refills | Status: DC

## 2022-11-04 ENCOUNTER — Other Ambulatory Visit: Payer: Self-pay | Admitting: Cardiology

## 2022-11-04 DIAGNOSIS — Z952 Presence of prosthetic heart valve: Secondary | ICD-10-CM

## 2022-11-04 DIAGNOSIS — Z5181 Encounter for therapeutic drug level monitoring: Secondary | ICD-10-CM

## 2022-11-17 ENCOUNTER — Ambulatory Visit (INDEPENDENT_AMBULATORY_CARE_PROVIDER_SITE_OTHER): Payer: 59 | Admitting: *Deleted

## 2022-11-17 DIAGNOSIS — Z952 Presence of prosthetic heart valve: Secondary | ICD-10-CM

## 2022-11-17 DIAGNOSIS — Z5181 Encounter for therapeutic drug level monitoring: Secondary | ICD-10-CM | POA: Diagnosis not present

## 2022-11-17 LAB — POCT INR: INR: 2.5 (ref 2.0–3.0)

## 2022-12-01 ENCOUNTER — Ambulatory Visit (INDEPENDENT_AMBULATORY_CARE_PROVIDER_SITE_OTHER): Payer: 59 | Admitting: *Deleted

## 2022-12-01 DIAGNOSIS — Z952 Presence of prosthetic heart valve: Secondary | ICD-10-CM

## 2022-12-01 DIAGNOSIS — Z5181 Encounter for therapeutic drug level monitoring: Secondary | ICD-10-CM

## 2022-12-01 LAB — POCT INR: INR: 2.7 (ref 2.0–3.0)

## 2022-12-01 NOTE — Patient Instructions (Addendum)
Description   PT AWARE FORM IS BEING SUBMITTED TO SELF TESTING AGENCY-Spoke with pt and advised to continue taking warfarin alternating dose of 7.5mg  and 11.25mg  daily. Recheck INR in 2 weeks-Self Tester. Call the Anticoagulation Clinic at [phone] with any new meds or upcoming procedures.

## 2022-12-15 ENCOUNTER — Ambulatory Visit (INDEPENDENT_AMBULATORY_CARE_PROVIDER_SITE_OTHER): Payer: 59 | Admitting: *Deleted

## 2022-12-15 DIAGNOSIS — Z952 Presence of prosthetic heart valve: Secondary | ICD-10-CM | POA: Diagnosis not present

## 2022-12-15 DIAGNOSIS — Z5181 Encounter for therapeutic drug level monitoring: Secondary | ICD-10-CM | POA: Diagnosis not present

## 2022-12-15 LAB — POCT INR: INR: 2.9 (ref 2.0–3.0)

## 2022-12-15 NOTE — Patient Instructions (Signed)
Description   PT AWARE FORM IS BEING SUBMITTED TO SELF TESTING AGENCY-Spoke with pt and advised to continue taking warfarin alternating dose of 7.5mg and 11.25mg daily. Recheck INR in 2 weeks-Self Tester. Call the Anticoagulation Clinic at [phone] with any new meds or upcoming procedures.       

## 2022-12-23 ENCOUNTER — Other Ambulatory Visit: Payer: Self-pay | Admitting: Cardiology

## 2022-12-23 DIAGNOSIS — Z952 Presence of prosthetic heart valve: Secondary | ICD-10-CM

## 2022-12-23 DIAGNOSIS — Z5181 Encounter for therapeutic drug level monitoring: Secondary | ICD-10-CM

## 2022-12-29 ENCOUNTER — Ambulatory Visit (INDEPENDENT_AMBULATORY_CARE_PROVIDER_SITE_OTHER): Payer: 59 | Admitting: *Deleted

## 2022-12-29 DIAGNOSIS — Z952 Presence of prosthetic heart valve: Secondary | ICD-10-CM | POA: Diagnosis not present

## 2022-12-29 DIAGNOSIS — Z5181 Encounter for therapeutic drug level monitoring: Secondary | ICD-10-CM | POA: Diagnosis not present

## 2022-12-29 LAB — POCT INR: INR: 2.5 (ref 2.0–3.0)

## 2022-12-29 NOTE — Patient Instructions (Signed)
Description   PT AWARE FORM IS BEING SUBMITTED TO SELF TESTING AGENCY-Spoke with pt and advised to continue taking warfarin alternating dose of 7.5mg and 11.25mg daily. Recheck INR in 2 weeks-Self Tester. Call the Anticoagulation Clinic at [phone] with any new meds or upcoming procedures.       

## 2023-01-11 LAB — POCT INR: INR: 2.8 (ref 2.0–3.0)

## 2023-01-12 ENCOUNTER — Ambulatory Visit (INDEPENDENT_AMBULATORY_CARE_PROVIDER_SITE_OTHER): Payer: 59 | Admitting: *Deleted

## 2023-01-12 DIAGNOSIS — Z952 Presence of prosthetic heart valve: Secondary | ICD-10-CM

## 2023-01-12 DIAGNOSIS — Z5181 Encounter for therapeutic drug level monitoring: Secondary | ICD-10-CM

## 2023-01-12 NOTE — Patient Instructions (Addendum)
Description   Spoke with pt and advised to continue taking warfarin alternating dose of 7.5mg and 11.25mg daily. Recheck INR in 2 weeks-Self Tester. Call the Anticoagulation Clinic at [phone] with any new meds or upcoming procedures.      

## 2023-01-25 LAB — POCT INR: INR: 2.9 (ref 2.0–3.0)

## 2023-01-26 ENCOUNTER — Ambulatory Visit (INDEPENDENT_AMBULATORY_CARE_PROVIDER_SITE_OTHER): Payer: 59 | Admitting: Cardiology

## 2023-01-26 ENCOUNTER — Telehealth: Payer: Self-pay

## 2023-01-26 DIAGNOSIS — Z952 Presence of prosthetic heart valve: Secondary | ICD-10-CM

## 2023-01-26 DIAGNOSIS — Z5181 Encounter for therapeutic drug level monitoring: Secondary | ICD-10-CM | POA: Diagnosis not present

## 2023-01-26 NOTE — Patient Instructions (Signed)
Description   Spoke with pt and advised to continue taking warfarin alternating dose of 7.5mg and 11.25mg daily. Recheck INR in 2 weeks-Self Tester. Call the Anticoagulation Clinic at [phone] with any new meds or upcoming procedures.      

## 2023-01-26 NOTE — Telephone Encounter (Signed)
Lmtcb and discuss INR result.

## 2023-02-08 LAB — POCT INR: INR: 3.5 — AB (ref 2.0–3.0)

## 2023-02-09 ENCOUNTER — Ambulatory Visit (INDEPENDENT_AMBULATORY_CARE_PROVIDER_SITE_OTHER): Payer: 59 | Admitting: Cardiovascular Disease

## 2023-02-09 DIAGNOSIS — Z5181 Encounter for therapeutic drug level monitoring: Secondary | ICD-10-CM

## 2023-02-09 DIAGNOSIS — Z952 Presence of prosthetic heart valve: Secondary | ICD-10-CM

## 2023-02-22 LAB — POCT INR: INR: 2.5 (ref 2.0–3.0)

## 2023-02-23 ENCOUNTER — Ambulatory Visit (INDEPENDENT_AMBULATORY_CARE_PROVIDER_SITE_OTHER): Payer: 59 | Admitting: *Deleted

## 2023-02-23 DIAGNOSIS — Z5181 Encounter for therapeutic drug level monitoring: Secondary | ICD-10-CM

## 2023-02-23 DIAGNOSIS — Z952 Presence of prosthetic heart valve: Secondary | ICD-10-CM

## 2023-02-23 NOTE — Patient Instructions (Signed)
Description   Spoke with pt and advised to continue taking warfarin alternating dose of 7.5mg and 11.25mg daily. Recheck INR in 2 weeks-Self Tester. Call the Anticoagulation Clinic at [phone] with any new meds or upcoming procedures.      

## 2023-03-08 LAB — POCT INR: INR: 2.8 (ref 2.0–3.0)

## 2023-03-09 ENCOUNTER — Telehealth: Payer: Self-pay | Admitting: *Deleted

## 2023-03-09 ENCOUNTER — Ambulatory Visit (INDEPENDENT_AMBULATORY_CARE_PROVIDER_SITE_OTHER): Payer: 59 | Admitting: *Deleted

## 2023-03-09 DIAGNOSIS — Z952 Presence of prosthetic heart valve: Secondary | ICD-10-CM

## 2023-03-09 DIAGNOSIS — Z5181 Encounter for therapeutic drug level monitoring: Secondary | ICD-10-CM

## 2023-03-09 NOTE — Telephone Encounter (Signed)
Called pt since INR was due on yesterday. There was no answer therefore left a message to call back and report to [place] and testing company.

## 2023-03-22 LAB — POCT INR: INR: 2.8 (ref 2.0–3.0)

## 2023-03-23 ENCOUNTER — Ambulatory Visit (INDEPENDENT_AMBULATORY_CARE_PROVIDER_SITE_OTHER): Payer: 59 | Admitting: Cardiology

## 2023-03-23 DIAGNOSIS — Z952 Presence of prosthetic heart valve: Secondary | ICD-10-CM | POA: Diagnosis not present

## 2023-03-23 DIAGNOSIS — Z5181 Encounter for therapeutic drug level monitoring: Secondary | ICD-10-CM

## 2023-04-05 LAB — POCT INR: INR: 3.5 — AB (ref 2.0–3.0)

## 2023-04-06 ENCOUNTER — Ambulatory Visit (INDEPENDENT_AMBULATORY_CARE_PROVIDER_SITE_OTHER): Payer: 59 | Admitting: Cardiovascular Disease

## 2023-04-06 ENCOUNTER — Telehealth: Payer: Self-pay | Admitting: *Deleted

## 2023-04-06 DIAGNOSIS — Z952 Presence of prosthetic heart valve: Secondary | ICD-10-CM

## 2023-04-06 DIAGNOSIS — Z5181 Encounter for therapeutic drug level monitoring: Secondary | ICD-10-CM

## 2023-04-06 NOTE — Telephone Encounter (Signed)
Called pt since INR is overdue; there was no answer so left her a message to call back regarding this.

## 2023-04-19 LAB — POCT INR: INR: 2.8 (ref 2.0–3.0)

## 2023-04-20 ENCOUNTER — Telehealth: Payer: Self-pay

## 2023-04-20 ENCOUNTER — Ambulatory Visit (INDEPENDENT_AMBULATORY_CARE_PROVIDER_SITE_OTHER): Payer: 59 | Admitting: *Deleted

## 2023-04-20 DIAGNOSIS — Z952 Presence of prosthetic heart valve: Secondary | ICD-10-CM | POA: Diagnosis not present

## 2023-04-20 DIAGNOSIS — Z5181 Encounter for therapeutic drug level monitoring: Secondary | ICD-10-CM

## 2023-04-20 NOTE — Patient Instructions (Addendum)
Description   Spoke with pt and advised to continue taking warfarin alternating dose of 7.5mg  and 11.25mg  daily. Recheck INR in 2 weeks-Self Tester (normally Monday test at night & call Tuesday). Call the Anticoagulation Clinic at [phone] with any new meds or upcoming procedures.

## 2023-04-20 NOTE — Telephone Encounter (Signed)
INR overdue. Called pt, no answer. Left message on voicemail with call back number,

## 2023-05-05 ENCOUNTER — Ambulatory Visit (INDEPENDENT_AMBULATORY_CARE_PROVIDER_SITE_OTHER): Payer: 59 | Admitting: *Deleted

## 2023-05-05 DIAGNOSIS — Z952 Presence of prosthetic heart valve: Secondary | ICD-10-CM

## 2023-05-05 DIAGNOSIS — Z5181 Encounter for therapeutic drug level monitoring: Secondary | ICD-10-CM

## 2023-05-05 LAB — POCT INR: INR: 3.5 — AB (ref 2.0–3.0)

## 2023-05-05 NOTE — Patient Instructions (Signed)
Description   Spoke with pt and advised to continue taking warfarin alternating dose of 7.5mg  and 11.25mg  daily. Recheck INR in 2 weeks-Self Tester (test on Monday night & call Tuesday). Call the Anticoagulation Clinic at [phone] with any new meds or upcoming procedures.

## 2023-05-18 ENCOUNTER — Ambulatory Visit (INDEPENDENT_AMBULATORY_CARE_PROVIDER_SITE_OTHER): Payer: 59 | Admitting: *Deleted

## 2023-05-18 DIAGNOSIS — Z952 Presence of prosthetic heart valve: Secondary | ICD-10-CM

## 2023-05-18 DIAGNOSIS — Z5181 Encounter for therapeutic drug level monitoring: Secondary | ICD-10-CM

## 2023-05-18 LAB — POCT INR: INR: 2.5 (ref 2.0–3.0)

## 2023-05-18 NOTE — Patient Instructions (Signed)
 Description   Spoke with pt and advised to continue taking warfarin alternating dose of 7.5mg  and 11.25mg  daily. Recheck INR in 2 weeks-Self Tester (test on Monday night & call Tuesday). Call the Anticoagulation Clinic at [phone] with any new meds or upcoming procedures.

## 2023-05-31 LAB — POCT INR: INR: 2.7 (ref 2.0–3.0)

## 2023-06-01 ENCOUNTER — Telehealth: Payer: Self-pay

## 2023-06-01 ENCOUNTER — Ambulatory Visit (INDEPENDENT_AMBULATORY_CARE_PROVIDER_SITE_OTHER): Payer: 59 | Admitting: Cardiology

## 2023-06-01 ENCOUNTER — Encounter: Payer: Self-pay | Admitting: Cardiology

## 2023-06-01 DIAGNOSIS — Z952 Presence of prosthetic heart valve: Secondary | ICD-10-CM

## 2023-06-01 DIAGNOSIS — Z5181 Encounter for therapeutic drug level monitoring: Secondary | ICD-10-CM | POA: Diagnosis not present

## 2023-06-01 NOTE — Progress Notes (Signed)
This encounter was created in error - please disregard.

## 2023-06-01 NOTE — Telephone Encounter (Signed)
INR due today. Called, no answer. Left message on voicemail with call back number.

## 2023-06-09 ENCOUNTER — Other Ambulatory Visit: Payer: Self-pay

## 2023-06-09 DIAGNOSIS — Z952 Presence of prosthetic heart valve: Secondary | ICD-10-CM

## 2023-06-09 DIAGNOSIS — Z5181 Encounter for therapeutic drug level monitoring: Secondary | ICD-10-CM

## 2023-06-09 MED ORDER — WARFARIN SODIUM 7.5 MG PO TABS: ORAL_TABLET | ORAL | 2 refills | Status: DC

## 2023-06-09 NOTE — Telephone Encounter (Signed)
Prescription refill request received for warfarin Lov: 08/31/22 [name])  Next INR check: 06/14/23 Warfarin tablet strength: 7.5mg   Appropriate dose. Refill sent.

## 2023-06-14 LAB — POCT INR: INR: 2.9 (ref 2.0–3.0)

## 2023-06-15 ENCOUNTER — Telehealth: Payer: Self-pay | Admitting: *Deleted

## 2023-06-15 ENCOUNTER — Ambulatory Visit (INDEPENDENT_AMBULATORY_CARE_PROVIDER_SITE_OTHER): Payer: 59 | Admitting: Cardiology

## 2023-06-15 DIAGNOSIS — Z5181 Encounter for therapeutic drug level monitoring: Secondary | ICD-10-CM

## 2023-06-15 DIAGNOSIS — Z952 Presence of prosthetic heart valve: Secondary | ICD-10-CM | POA: Diagnosis not present

## 2023-06-15 NOTE — Telephone Encounter (Signed)
Called pt since INR testing was due on yesterday. There was no answer so left her a message to call back regarding this. Will await a call back and follow up.

## 2023-06-28 LAB — POCT INR: INR: 3.5 — AB (ref 2.0–3.0)

## 2023-06-29 ENCOUNTER — Telehealth: Payer: Self-pay | Admitting: *Deleted

## 2023-06-29 ENCOUNTER — Ambulatory Visit (INDEPENDENT_AMBULATORY_CARE_PROVIDER_SITE_OTHER): Payer: 59 | Admitting: Cardiology

## 2023-06-29 DIAGNOSIS — Z952 Presence of prosthetic heart valve: Secondary | ICD-10-CM | POA: Diagnosis not present

## 2023-06-29 DIAGNOSIS — Z5181 Encounter for therapeutic drug level monitoring: Secondary | ICD-10-CM | POA: Diagnosis not present

## 2023-06-29 NOTE — Telephone Encounter (Signed)
Called pt since INR is due. There was no answer so left a message. Will await a call back and follow up.

## 2023-07-03 IMAGING — MG MM DIGITAL SCREENING BILAT W/ TOMO AND CAD
8 series · 8 of 24 positions shown · non-contrast
Comparison: Previous exam(s).

CLINICAL DATA: Screening.

EXAM:
DIGITAL SCREENING BILATERAL MAMMOGRAM WITH TOMOSYNTHESIS AND CAD
TECHNIQUE: Bilateral screening digital craniocaudal and mediolateral oblique
mammograms were obtained. Bilateral screening digital breast
tomosynthesis was performed. The images were evaluated with
computer-aided detection.

[L MLO synth-2D]
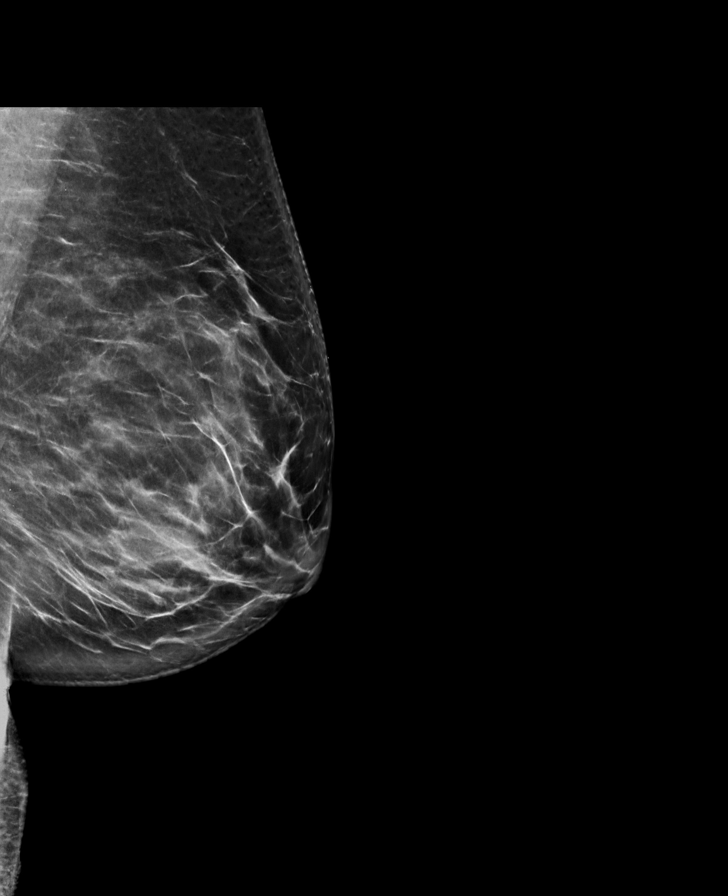

[R CC synth-2D]
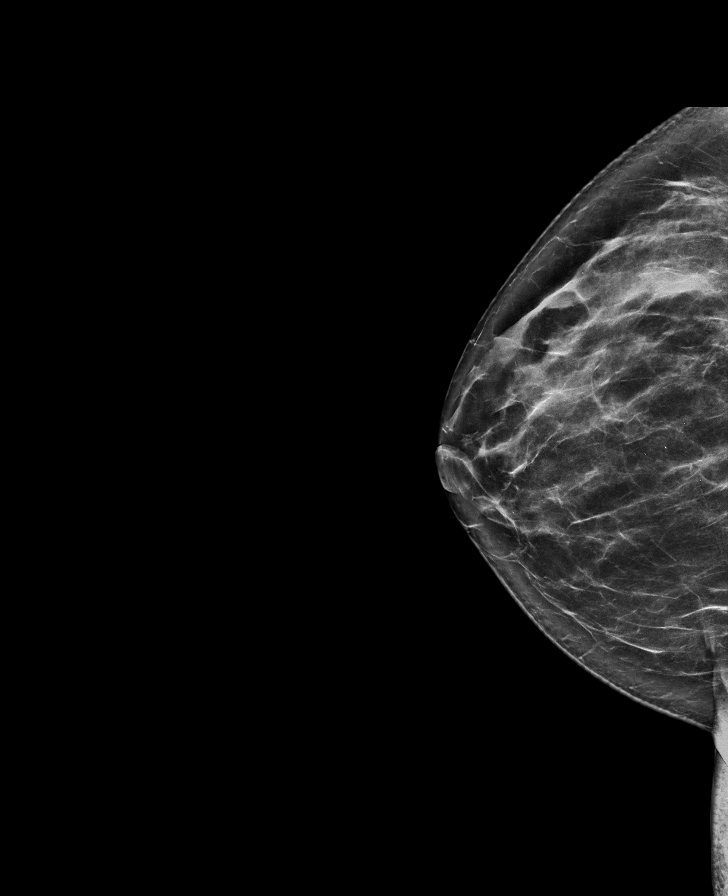

[L CC synth-2D]
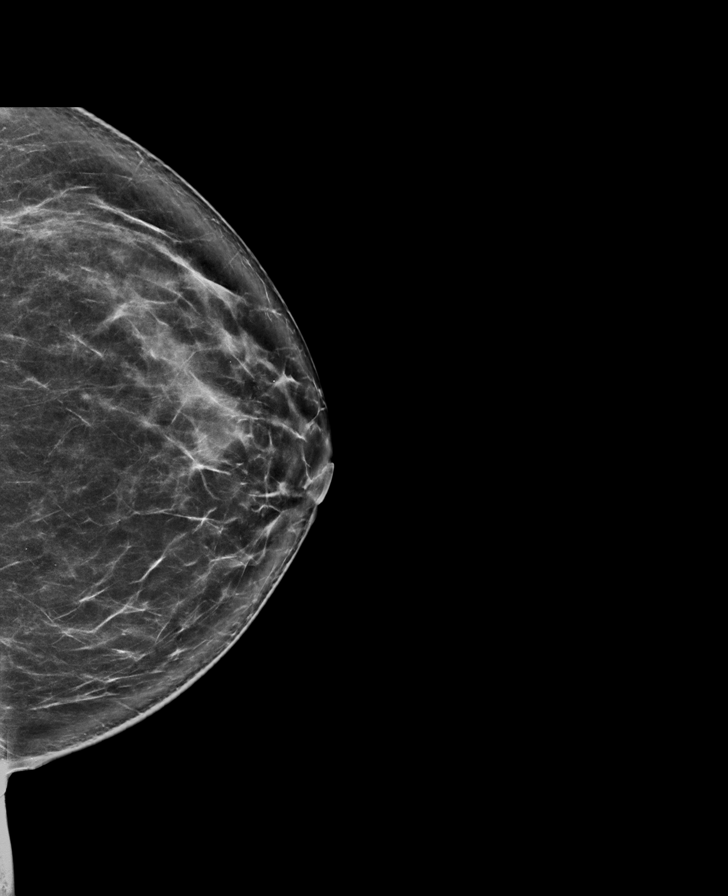

[R MLO synth-2D]
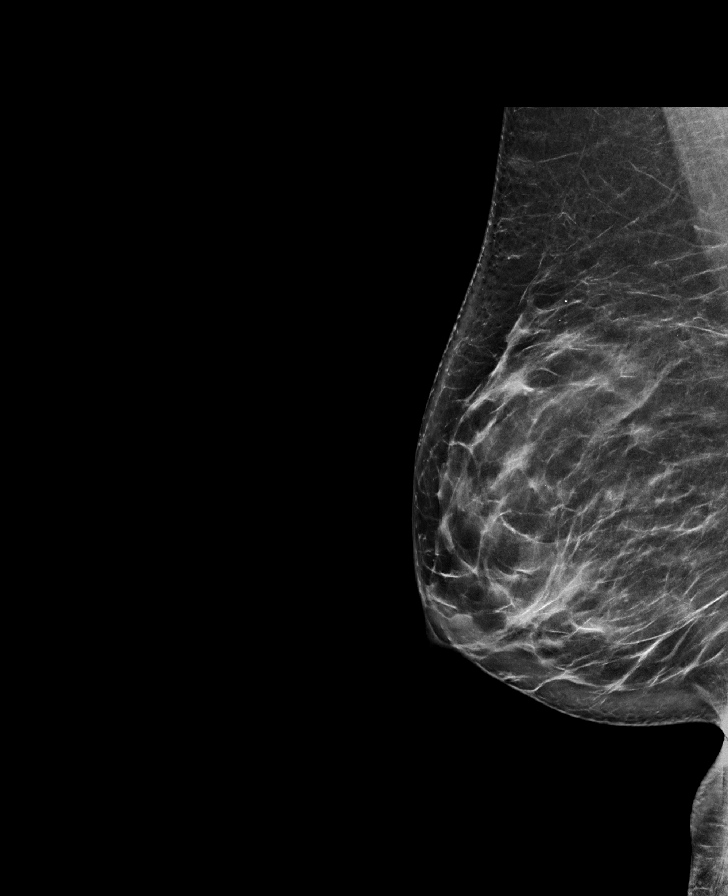

[L MLO tomo · tomo slice 50/99.0]
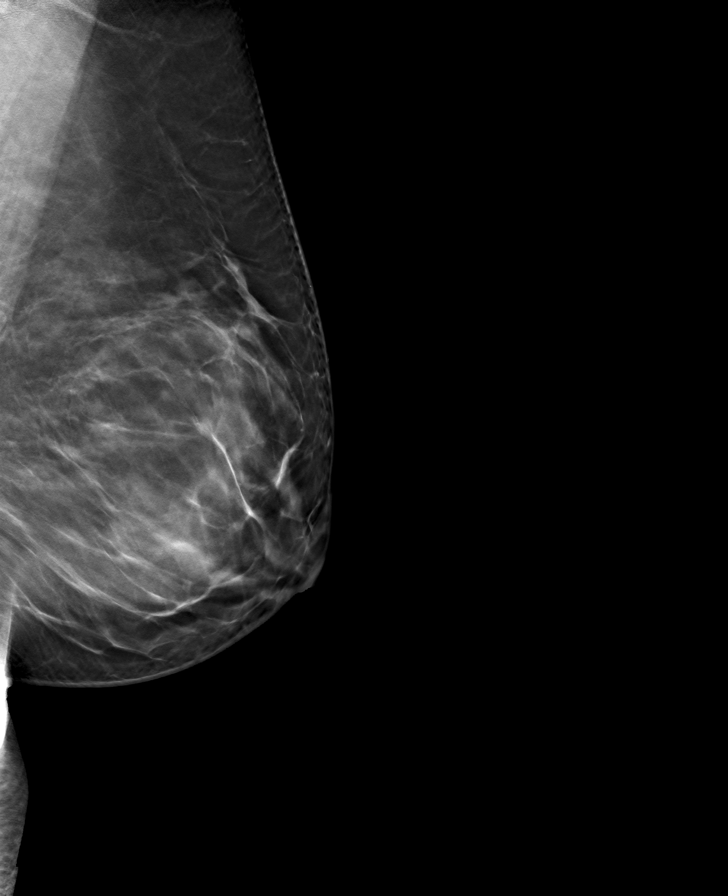

[L CC tomo · tomo slice 48/95.0]
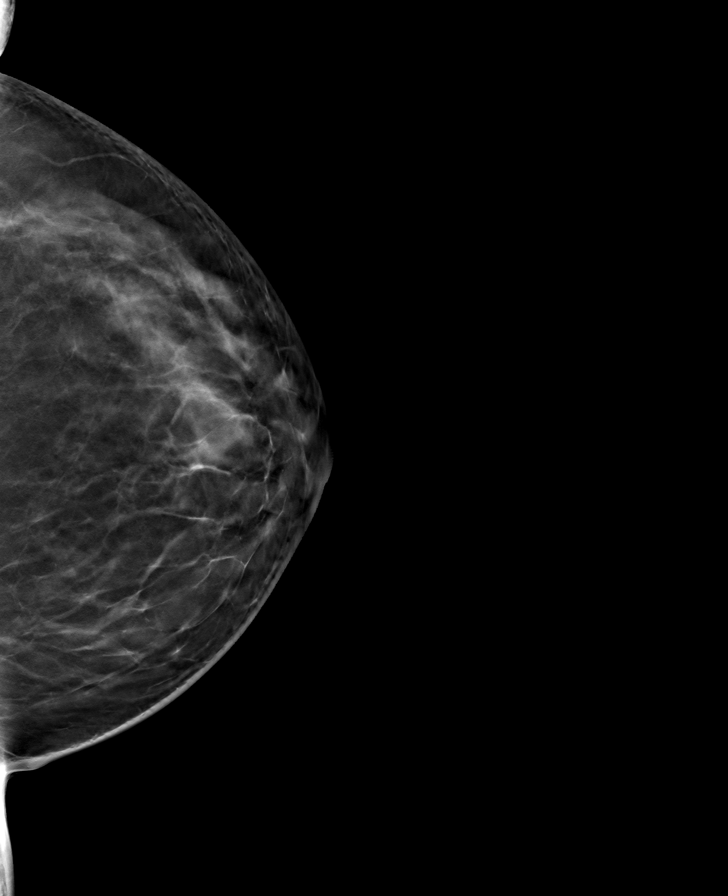

[R CC tomo · tomo slice 41/81.0]
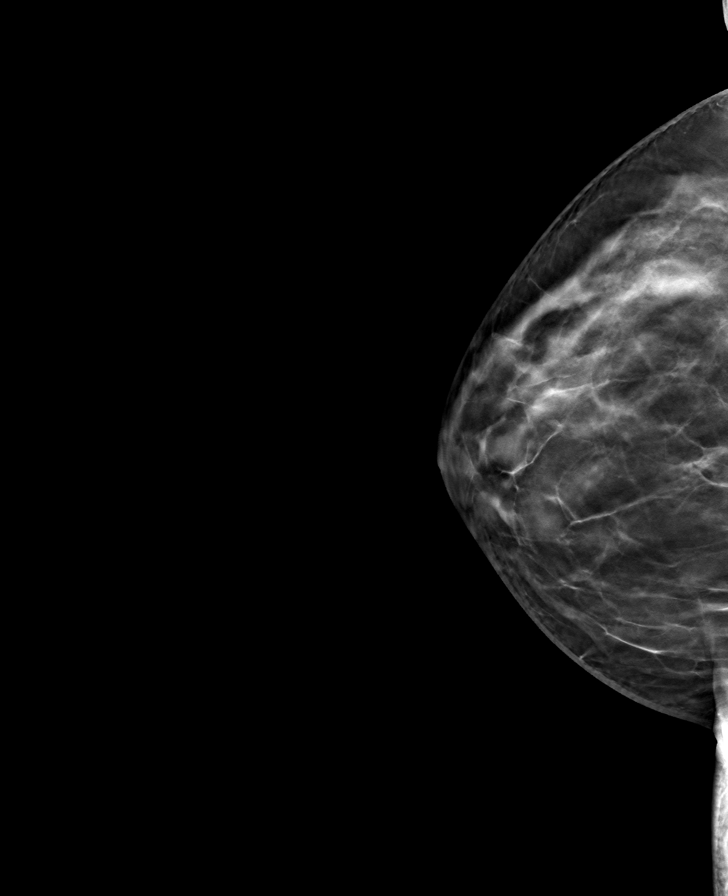

[R MLO tomo · tomo slice 47/92.0]
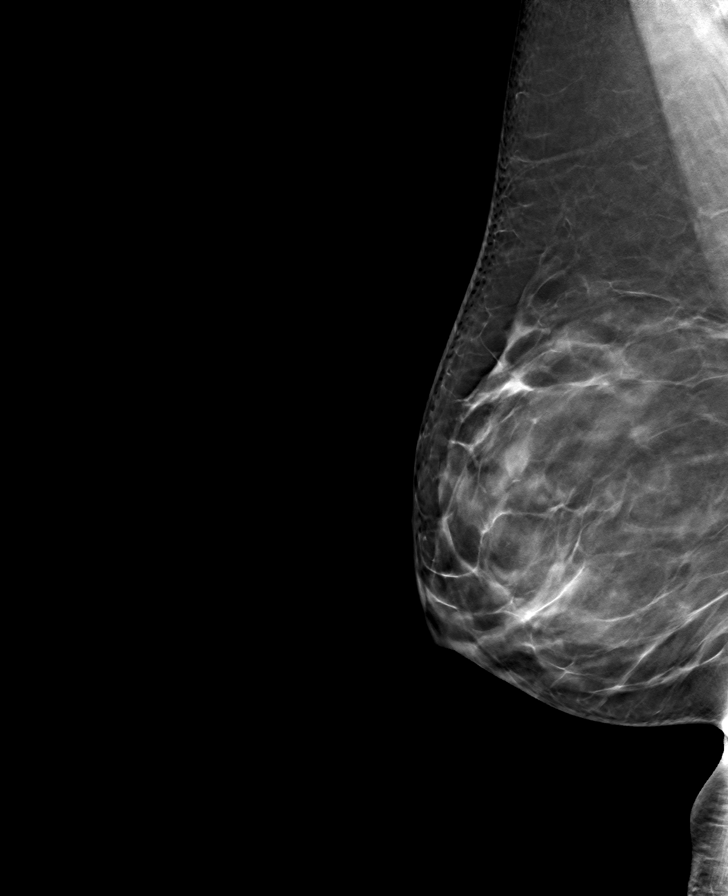

[8 of 24 positions shown; findings below may reference images not displayed]

ACR Breast Density Category c: The breast tissue is heterogeneously
dense, which may obscure small masses.
FINDINGS: There are no findings suspicious for malignancy.
IMPRESSION: No mammographic evidence of malignancy. A result letter of this
screening mammogram will be mailed directly to the patient.

RECOMMENDATION:
Screening mammogram in one year. (Code:Q3-W-BC3)

BI-RADS CATEGORY  1: Negative.

## 2023-07-13 ENCOUNTER — Ambulatory Visit (INDEPENDENT_AMBULATORY_CARE_PROVIDER_SITE_OTHER): Payer: 59 | Admitting: *Deleted

## 2023-07-13 ENCOUNTER — Other Ambulatory Visit: Payer: Self-pay | Admitting: Obstetrics and Gynecology

## 2023-07-13 DIAGNOSIS — Z1231 Encounter for screening mammogram for malignant neoplasm of breast: Secondary | ICD-10-CM

## 2023-07-13 DIAGNOSIS — Z952 Presence of prosthetic heart valve: Secondary | ICD-10-CM | POA: Diagnosis not present

## 2023-07-13 DIAGNOSIS — Z5181 Encounter for therapeutic drug level monitoring: Secondary | ICD-10-CM

## 2023-07-13 LAB — POCT INR: INR: 3.5 — AB (ref 2.0–3.0)

## 2023-07-13 NOTE — Patient Instructions (Signed)
Description   Spoke with pt and advised to continue taking warfarin alternating dose of 7.5mg  and 11.25mg  daily. Recheck INR in 2 weeks-Self Tester (test on Monday night & call Tuesday). Call the Anticoagulation Clinic at [phone] with any new meds or upcoming procedures.

## 2023-07-26 LAB — POCT INR: INR: 2.9 (ref 2.0–3.0)

## 2023-07-27 ENCOUNTER — Telehealth: Payer: Self-pay | Admitting: *Deleted

## 2023-07-27 ENCOUNTER — Ambulatory Visit (INDEPENDENT_AMBULATORY_CARE_PROVIDER_SITE_OTHER): Payer: 59 | Admitting: Cardiology

## 2023-07-27 DIAGNOSIS — Z952 Presence of prosthetic heart valve: Secondary | ICD-10-CM | POA: Diagnosis not present

## 2023-07-27 DIAGNOSIS — Z5181 Encounter for therapeutic drug level monitoring: Secondary | ICD-10-CM | POA: Diagnosis not present

## 2023-07-27 NOTE — Telephone Encounter (Signed)
Called pt since INR is due; there was no answer so left her a message to call back regarding this.

## 2023-08-10 ENCOUNTER — Ambulatory Visit (INDEPENDENT_AMBULATORY_CARE_PROVIDER_SITE_OTHER): Payer: 59 | Admitting: *Deleted

## 2023-08-10 ENCOUNTER — Other Ambulatory Visit: Payer: Self-pay | Admitting: *Deleted

## 2023-08-10 ENCOUNTER — Telehealth: Payer: Self-pay | Admitting: *Deleted

## 2023-08-10 DIAGNOSIS — Z952 Presence of prosthetic heart valve: Secondary | ICD-10-CM | POA: Diagnosis not present

## 2023-08-10 DIAGNOSIS — Z5181 Encounter for therapeutic drug level monitoring: Secondary | ICD-10-CM

## 2023-08-10 LAB — POCT INR: INR: 2.5 (ref 2.0–3.0)

## 2023-08-10 MED ORDER — WARFARIN SODIUM 7.5 MG PO TABS: ORAL_TABLET | ORAL | 3 refills | Status: DC

## 2023-08-10 NOTE — Telephone Encounter (Signed)
Pt called back and stated her heat broke in her house and she is unable to warm machine up and will perform testing tonight.

## 2023-08-10 NOTE — Patient Instructions (Signed)
Description   Spoke with pt and advised to continue taking warfarin alternating dose of 7.5mg  and 11.25mg  daily. Recheck INR in 2 weeks-Self Tester (test on Monday night & call Tuesday). Call the Anticoagulation Clinic at [phone] with any new meds or upcoming procedures.

## 2023-08-10 NOTE — Telephone Encounter (Signed)
Called pt since INR is due; she states she forgot and will take of it now. Will await and follow up

## 2023-08-23 ENCOUNTER — Ambulatory Visit
Admission: RE | Admit: 2023-08-23 | Discharge: 2023-08-23 | Disposition: A | Discharge status: Home / Self Care | Payer: 59 | Source: Ambulatory Visit | Attending: Obstetrics and Gynecology

## 2023-08-23 DIAGNOSIS — Z1231 Encounter for screening mammogram for malignant neoplasm of breast: Secondary | ICD-10-CM

## 2023-08-24 ENCOUNTER — Ambulatory Visit (INDEPENDENT_AMBULATORY_CARE_PROVIDER_SITE_OTHER): Payer: 59 | Admitting: *Deleted

## 2023-08-24 ENCOUNTER — Telehealth: Payer: Self-pay | Admitting: *Deleted

## 2023-08-24 DIAGNOSIS — Z5181 Encounter for therapeutic drug level monitoring: Secondary | ICD-10-CM

## 2023-08-24 DIAGNOSIS — Z952 Presence of prosthetic heart valve: Secondary | ICD-10-CM | POA: Diagnosis not present

## 2023-08-24 LAB — POCT INR: INR: 2.6 (ref 2.0–3.0)

## 2023-08-24 NOTE — Telephone Encounter (Signed)
Called pt since IRN is due; there was no answer so left a message to call back with an update/results.

## 2023-08-24 NOTE — Patient Instructions (Signed)
Description   Spoke with pt and advised to continue taking warfarin alternating dose of 7.5mg  and 11.25mg  daily. Recheck INR in 2 weeks-Self Tester (test on Monday night & call Tuesday). Call the Anticoagulation Clinic at [phone] with any new meds or upcoming procedures.

## 2023-09-06 LAB — POCT INR: INR: 2.5 (ref 2.0–3.0)

## 2023-09-07 ENCOUNTER — Ambulatory Visit (INDEPENDENT_AMBULATORY_CARE_PROVIDER_SITE_OTHER): Payer: Self-pay | Admitting: Pharmacist Clinician (PhC)/ Clinical Pharmacy Specialist

## 2023-09-07 ENCOUNTER — Telehealth: Payer: Self-pay | Admitting: *Deleted

## 2023-09-07 DIAGNOSIS — Z952 Presence of prosthetic heart valve: Secondary | ICD-10-CM | POA: Diagnosis not present

## 2023-09-07 DIAGNOSIS — Z5181 Encounter for therapeutic drug level monitoring: Secondary | ICD-10-CM

## 2023-09-07 NOTE — Telephone Encounter (Signed)
 Called pt since INR is due; there was no answer so left a message.

## 2023-09-10 ENCOUNTER — Other Ambulatory Visit: Payer: Self-pay | Admitting: Cardiology

## 2023-09-10 DIAGNOSIS — Z952 Presence of prosthetic heart valve: Secondary | ICD-10-CM

## 2023-09-10 DIAGNOSIS — Z5181 Encounter for therapeutic drug level monitoring: Secondary | ICD-10-CM

## 2023-09-20 LAB — POCT INR: INR: 2.9 (ref 2.0–3.0)

## 2023-09-21 ENCOUNTER — Ambulatory Visit (INDEPENDENT_AMBULATORY_CARE_PROVIDER_SITE_OTHER): Payer: 59 | Admitting: Internal Medicine

## 2023-09-21 DIAGNOSIS — Z952 Presence of prosthetic heart valve: Secondary | ICD-10-CM | POA: Diagnosis not present

## 2023-09-21 DIAGNOSIS — Z5181 Encounter for therapeutic drug level monitoring: Secondary | ICD-10-CM | POA: Diagnosis not present

## 2023-10-05 ENCOUNTER — Telehealth: Payer: Self-pay | Admitting: *Deleted

## 2023-10-05 ENCOUNTER — Ambulatory Visit (INDEPENDENT_AMBULATORY_CARE_PROVIDER_SITE_OTHER): Payer: 59 | Admitting: Cardiology

## 2023-10-05 DIAGNOSIS — Z5181 Encounter for therapeutic drug level monitoring: Secondary | ICD-10-CM | POA: Diagnosis not present

## 2023-10-05 DIAGNOSIS — Z952 Presence of prosthetic heart valve: Secondary | ICD-10-CM

## 2023-10-05 LAB — POCT INR: INR: 3.1 — AB (ref 2.0–3.0)

## 2023-10-05 NOTE — Telephone Encounter (Signed)
Called pt since INR is due; there was no answer so left a message for her to call back regarding this.

## 2023-10-18 LAB — POCT INR: INR: 2.5 (ref 2.0–3.0)

## 2023-10-19 ENCOUNTER — Ambulatory Visit: Payer: 59 | Attending: Cardiology | Admitting: Cardiology

## 2023-10-19 ENCOUNTER — Encounter: Payer: Self-pay | Admitting: Cardiology

## 2023-10-19 ENCOUNTER — Ambulatory Visit (INDEPENDENT_AMBULATORY_CARE_PROVIDER_SITE_OTHER): Payer: Self-pay | Admitting: Cardiology

## 2023-10-19 VITALS — BP 126/86 | HR 74 | Ht 63.5 in | Wt 162.2 lb

## 2023-10-19 DIAGNOSIS — Z7901 Long term (current) use of anticoagulants: Secondary | ICD-10-CM | POA: Diagnosis not present

## 2023-10-19 DIAGNOSIS — Z952 Presence of prosthetic heart valve: Secondary | ICD-10-CM | POA: Diagnosis not present

## 2023-10-19 DIAGNOSIS — Z5181 Encounter for therapeutic drug level monitoring: Secondary | ICD-10-CM

## 2023-10-19 DIAGNOSIS — I10 Essential (primary) hypertension: Secondary | ICD-10-CM | POA: Diagnosis not present

## 2023-10-19 DIAGNOSIS — Z8679 Personal history of other diseases of the circulatory system: Secondary | ICD-10-CM

## 2023-10-19 DIAGNOSIS — R002 Palpitations: Secondary | ICD-10-CM

## 2023-10-19 DIAGNOSIS — S0300XD Dislocation of jaw, unspecified side, subsequent encounter: Secondary | ICD-10-CM

## 2023-10-19 DIAGNOSIS — S0300XA Dislocation of jaw, unspecified side, initial encounter: Secondary | ICD-10-CM

## 2023-10-19 MED ORDER — AMOXICILLIN 500 MG PO TABS: ORAL_TABLET | ORAL | 4 refills | Status: AC

## 2023-10-19 MED ORDER — WARFARIN SODIUM 7.5 MG PO TABS: ORAL_TABLET | ORAL | 1 refills | Status: DC

## 2023-10-19 MED ORDER — PROPRANOLOL HCL 10 MG PO TABS: 10.0000 mg | ORAL_TABLET | Freq: Every day | ORAL | 3 refills | Status: DC

## 2023-10-19 NOTE — Progress Notes (Signed)
 Cardiology Office Note:  .   Date:  10/21/2023  ID:  Burnard [name], DOB 1981/01/11, MRN [id number] PCP: Storm Setter, DO  [facility] Providers Cardiologist:  Alm Clay, MD     Chief Complaint  Patient presents with   Follow-up   Cardiac Valve Problem    Status post aortic and mitral valve replacement-mechanical in 2010    Patient Profile: .     [name] is a very pleasant 43 y.o. female  with a PMH notable for history of mechanical AVR/MVR in 2010 (self monitors INR's at home; history of CHF that resolved after valve surgery) who presents here for annual follow-up at the request of Storm Setter, DO.  History of Rheumatic Heart Disease => severe MR with moderate to severe AI with mildly reduced EF AVR/MVR: 21 mm Sorin [employer] Top Hat Aortic, and 33 mm Sorin [employer] OptiForm Mitral mechanical prosthesis (June 2010); Dr. 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  Doing well.  No further chest pain or palpitations.-Taken a few dose of propranolol .  Otherwise stable.  Echo ordered.  Amoxicillin  prophylaxis for SBE ordered.  Propranolol  refilled.  Subjective  Discussed the use of AI scribe software for clinical note transcription with the patient, who gave verbal consent to proceed.  History of Present Illness   [name] is a 43 year old female with a history of Aortic & Mitral Valve replacement (Initially for Rheumatic Heart Dz)who presents for a routine follow-up.  She manages her Coumadin  therapy with home INR testing every two weeks, and her levels have been stable. Her last echocardiogram indicated stable valve function, with some mitral valve regurgitation but no significant changes in the aortic valve gradients. She has not experienced recent palpitations  and is taking propranolol  10 mg once daily, which effectively manages her symptoms without causing hypotension or bradycardia.  She experiences exertional dyspnea, particularly when walking quickly (or carrying something) uphill, which resolves with rest. She engages in physical activity by walking every other day for about 30 to 40 minutes and does not experience chest tightness or pressure during these activities.  She Really does not get dyspneic with her routine exercise.  No chest pain or pressure at rest or with exertion.  NO CHF symptoms of PND, orthopnea or edema.   No rapid irregular heartbeats or palpitations.  No syncope or near syncope.  No TIA or amaurosis fugax.  No claudication.  She is currently taking Coumadin  and has an active prescription for amoxicillin  for prophylaxis during dental or GI procedures, which she uses as needed. She also takes Flexeril for TMJ-related jaw pain, using half a capsule when she feels soreness. She has been experiencing jaw popping for a couple of years and is awaiting a night guard to help manage the symptoms.       Objective   Medications - Coumadin  7.5 mg tablets taking 1 or 1.5 tablets daily as directed - Inderal  (propranolol  XL) 10 mg daily.  - Amoxicillin  500 mg-as PRN for dental procedures/GI procedures - SBE prophylaxis - Flexeril 5 mg nightly for TMJ  Studies Reviewed: SABRA   EKG Interpretation Date/Time:  Tuesday October 19 2023 09:52:55 EST Ventricular Rate:  74 PR Interval:  150 QRS Duration:  84 QT Interval:  420 QTC Calculation: 466 R Axis:  27  Text Interpretation: Normal sinus rhythm Nonspecific T wave abnormality Early repolarization pattern Prolonged QT When compared with ECG of 05-Apr-2009 07:44, ST no longer depressed in Lateral leads T wave inversion no longer evident in Lateral leads Confirmed by Anner Lenis ([id number]) on 10/19/2023 10:13:17 AM    Echo stress test 05/30/2015: Normal study after maximal exercise.  ECHO  (09/01/2022): 21 mm Sorin mechanical aortic valve-moderately elevated gradient, unchanged August 2020.  Normal EF 60 to 65% with no RWMA.  GR 2 DD.  Moderate LA dilation.   Trivial MR but otherwise normal 30 mm Sorin [employer] mechanical Mitral Valve  Risk Assessment/Calculations:        Physical Exam:   VS:  BP 126/86 (BP Location: Left Arm, Patient Position: Sitting, Cuff Size: Normal)   Pulse 74   Ht 5' 3.5 (1.613 m)   Wt 162 lb 3.2 oz (73.6 kg)   SpO2 98%   BMI 28.28 kg/m    Wt Readings from Last 3 Encounters:  10/19/23 162 lb 3.2 oz (73.6 kg)  08/31/22 154 lb (69.9 kg)  05/16/22 156 lb (70.8 kg)    GEN: Well nourished, well groomed;  in no acute distress; heathy appearing NECK: No JVD; No carotid bruits CARDIAC: RRR; crisp metallic S1, S2;  2-3/6 SEM heard throughout; No rubs, gallops RESPIRATORY:  Clear to auscultation without rales, wheezing or rhonchi ; nonlabored, good air movement. ABDOMEN: Soft, non-tender, non-distended EXTREMITIES:  No edema; No deformity     Assessment & Plan   ASSESSMENT AND PLAN: .    Problem List Items Addressed This Visit       Cardiology Problems   Essential hypertension (Chronic)   Excellent blood pressure control.  He is on propranolol /Inderal  10 mg for combination of blood pressure and palpitations. -Continue 10 mg INDERAL       Relevant Medications   warfarin (COUMADIN ) 7.5 MG tablet   propranolol  (INDERAL ) 10 MG tablet   Other Relevant Orders   EKG 12-Lead (Completed)     Other   Encounter for therapeutic drug monitoring   Home monitoring of INR checks.      Relevant Medications   warfarin (COUMADIN ) 7.5 MG tablet   H/O aortic valve replacement (Chronic)   Relevant Medications   warfarin (COUMADIN ) 7.5 MG tablet   H/O mitral valve replacement (Chronic)   Relevant Medications   warfarin (COUMADIN ) 7.5 MG tablet   H/O valvular heart disease - Primary (Chronic)   Status post mitral and aortic valve replacement for  progression of rheumatic heart disease MR and AI.  This was complicated by acute heart failure that is subsequently resolved. Otherwise has been doing well for years. Stable with no new symptoms.  Echo showed stable valve function and gradients. Mild mitral regurgitation noted. Atrial dilation present due to valve issues. -Continue current management with warfarin to prevent thrombotic occlusion of valves -Continue beta-blocker for BP and rate control -Maintain prescription for PRN amoxicillin  for SBE prophylaxis (GI or dental procedures) -Schedule follow-up echo for January 2027.      Long term current use of anticoagulant therapy (Chronic)   Chronic anticoagulation for aortic and mitral valve replacement.  Targeting INR 2.5-3.5. Has not been having any issues with home monitoring. INR monitoring at home with results recorded every two weeks. -Continue current management.  Based on aortic and mitral valve prostheses, would need bridging with either Lovenox or heparin if warfarin needs to be held for procedures or surgeries.  This would need to be managed  by our lipid clinic.      Rapid palpitations (Chronic)   Well controlled on Propranolol  10mg  daily. No need for additional doses. -Continue Propranolol  10mg  daily. -If palpitations recur, can take another 1/2-1 full tablet of propranolol .      TMJ (dislocation of temporomandibular joint)   Patient reports jaw pain. Currently managed with Flexeril as needed. Dental intervention planned with a night guard. -Continue current management.        General Health Maintenance -Ensure primary care physician is monitoring cholesterol and HbA1c. -Continue regular exercise regimen of walking every other day for 30-40 minutes. Follow-Up: Return in about 1 year (around 10/18/2024).       Signed, Alm MICAEL Clay, MD, MS Alm Clay, M.D., M.S. Interventional Cardiologist  [facility]  Pager # [phone] Phone #  [phone] [address] [place], [place] [address]

## 2023-10-19 NOTE — Patient Instructions (Addendum)
 Medication Instructions:   No changes *If you need a refill on your cardiac medications before your next appointment, please call your pharmacy*   Lab Work: Not needed Please have primary to send a copy lab results    Testing/Procedures: Not needed   Follow-Up: At [facility], you and your health needs are our priority.  As part of our continuing mission to provide you with exceptional heart care, we have created designated Provider Care Teams.  These Care Teams include your primary Cardiologist (physician) and Advanced Practice Providers (APPs -  Physician Assistants and Nurse Practitioners) who all work together to provide you with the care you need, when you need it.     Your next appointment:   12 month(s)  The format for your next appointment:   In Person  Provider:   Alm Clay, MD    Other Instructions

## 2023-10-21 ENCOUNTER — Encounter: Payer: Self-pay | Admitting: Cardiology

## 2023-10-21 DIAGNOSIS — Z8679 Personal history of other diseases of the circulatory system: Secondary | ICD-10-CM | POA: Insufficient documentation

## 2023-10-21 DIAGNOSIS — S0300XA Dislocation of jaw, unspecified side, initial encounter: Secondary | ICD-10-CM | POA: Insufficient documentation

## 2023-10-21 NOTE — Assessment & Plan Note (Signed)
 Well controlled on Propranolol  10mg  daily. No need for additional doses. -Continue Propranolol  10mg  daily. -If palpitations recur, can take another 1/2-1 full tablet of propranolol .

## 2023-10-21 NOTE — Assessment & Plan Note (Signed)
 Status post mitral and aortic valve replacement for progression of rheumatic heart disease MR and AI.  This was complicated by acute heart failure that is subsequently resolved. Otherwise has been doing well for years. Stable with no new symptoms.  Echo showed stable valve function and gradients. Mild mitral regurgitation noted. Atrial dilation present due to valve issues. -Continue current management with warfarin to prevent thrombotic occlusion of valves -Continue beta-blocker for BP and rate control -Maintain prescription for PRN amoxicillin  for SBE prophylaxis (GI or dental procedures) -Schedule follow-up echo for January 2027.

## 2023-10-21 NOTE — Assessment & Plan Note (Signed)
 Excellent blood pressure control.  He is on propranolol /Inderal  10 mg for combination of blood pressure and palpitations. -Continue 10 mg INDERAL 

## 2023-10-21 NOTE — Assessment & Plan Note (Signed)
 Home monitoring of INR checks.

## 2023-10-21 NOTE — Assessment & Plan Note (Signed)
 Chronic anticoagulation for aortic and mitral valve replacement.  Targeting INR 2.5-3.5. Has not been having any issues with home monitoring. INR monitoring at home with results recorded every two weeks. -Continue current management.  Based on aortic and mitral valve prostheses, would need bridging with either Lovenox or heparin if warfarin needs to be held for procedures or surgeries.  This would need to be managed by our lipid clinic.

## 2023-10-21 NOTE — Assessment & Plan Note (Signed)
 Overall doing well.  Stable.  Maintaining INR's in target range. Palpitations and BP well-controlled with Inderal .  Plan for follow-up echocardiogram in 2027.

## 2023-10-21 NOTE — Assessment & Plan Note (Signed)
 Patient reports jaw pain. Currently managed with Flexeril as needed. Dental intervention planned with a night guard. -Continue current management.

## 2023-11-01 LAB — POCT INR: INR: 3.4 — AB (ref 2.0–3.0)

## 2023-11-02 ENCOUNTER — Telehealth: Payer: Self-pay | Admitting: *Deleted

## 2023-11-02 ENCOUNTER — Ambulatory Visit (INDEPENDENT_AMBULATORY_CARE_PROVIDER_SITE_OTHER): Payer: Self-pay | Admitting: *Deleted

## 2023-11-02 DIAGNOSIS — Z5181 Encounter for therapeutic drug level monitoring: Secondary | ICD-10-CM | POA: Diagnosis not present

## 2023-11-02 DIAGNOSIS — Z952 Presence of prosthetic heart valve: Secondary | ICD-10-CM

## 2023-11-02 NOTE — Patient Instructions (Signed)
Description   Spoke with pt and advised to continue taking warfarin alternating dose of 7.5mg  and 11.25mg  daily. Recheck INR in 2 weeks-Self Tester (test on Monday night & call Tuesday). Call the Anticoagulation Clinic at [phone] with any new meds or upcoming procedures.

## 2023-11-02 NOTE — Telephone Encounter (Signed)
Called pt to see if INR had been done and she stated she has; please see Anticoagulation Encounter.

## 2023-11-16 ENCOUNTER — Ambulatory Visit (INDEPENDENT_AMBULATORY_CARE_PROVIDER_SITE_OTHER): Payer: Self-pay | Admitting: Cardiovascular Disease

## 2023-11-16 DIAGNOSIS — Z5181 Encounter for therapeutic drug level monitoring: Secondary | ICD-10-CM

## 2023-11-16 DIAGNOSIS — Z952 Presence of prosthetic heart valve: Secondary | ICD-10-CM

## 2023-11-16 LAB — POCT INR: INR: 2.8 (ref 2.0–3.0)

## 2023-11-24 ENCOUNTER — Telehealth: Payer: Self-pay | Admitting: Pharmacist

## 2023-11-24 NOTE — Telephone Encounter (Signed)
 Called and spoke with patient. Currently tests her INR at home every 2 weeks and calls in results to [employer] (home INR testing company). She then receives a bill from [place] as well as [employer] (~30 dollars each) so a monthly total of about 120 dollars. Was wondering what her charge would be if she came into the office for INR check.

## 2023-11-24 NOTE — Telephone Encounter (Signed)
 Note made in follow up book (Teams) for Monday/Tuesday to ask if she would like to come into the office for INR checks. Will follow up at that time.

## 2023-11-24 NOTE — Telephone Encounter (Signed)
 For an in-office INR check, the charges are 02725 $40 and 85610 $16.  I have no idea regarding the medication charges.

## 2023-11-24 NOTE — Telephone Encounter (Signed)
-----   Message from Nurse Casimiro Needle D sent at 11/23/2023 10:07 AM EDT ----- Regarding: INR Home Checks Patient would like you to call her regarding Self-testing billing.  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  Thank you

## 2023-11-24 NOTE — Telephone Encounter (Signed)
 Called and spoke with patient regarding billing. Likely will switch to in-office testing. Due for next INR on Monday and will discuss with RN over the phone then

## 2023-11-29 LAB — POCT INR: INR: 2.5 (ref 2.0–3.0)

## 2023-11-30 ENCOUNTER — Ambulatory Visit (INDEPENDENT_AMBULATORY_CARE_PROVIDER_SITE_OTHER): Admitting: Internal Medicine

## 2023-11-30 DIAGNOSIS — Z952 Presence of prosthetic heart valve: Secondary | ICD-10-CM

## 2023-11-30 DIAGNOSIS — Z5181 Encounter for therapeutic drug level monitoring: Secondary | ICD-10-CM

## 2023-12-14 ENCOUNTER — Other Ambulatory Visit (HOSPITAL_COMMUNITY): Payer: Self-pay

## 2023-12-14 ENCOUNTER — Other Ambulatory Visit: Payer: Self-pay

## 2023-12-14 ENCOUNTER — Telehealth: Payer: Self-pay | Admitting: *Deleted

## 2023-12-14 ENCOUNTER — Ambulatory Visit (INDEPENDENT_AMBULATORY_CARE_PROVIDER_SITE_OTHER): Admitting: Cardiology

## 2023-12-14 DIAGNOSIS — Z952 Presence of prosthetic heart valve: Secondary | ICD-10-CM | POA: Diagnosis not present

## 2023-12-14 DIAGNOSIS — Z5181 Encounter for therapeutic drug level monitoring: Secondary | ICD-10-CM

## 2023-12-14 LAB — POCT INR: INR: 2.5 (ref 2.0–3.0)

## 2023-12-14 MED ORDER — WARFARIN SODIUM 7.5 MG PO TABS
ORAL_TABLET | ORAL | 1 refills | Status: DC
  Filled 2023-12-14: qty 135, 90d supply, fill #0

## 2023-12-14 NOTE — Telephone Encounter (Signed)
 Called pt since INR is due, there was no answer so left a message for her to call back regarding this. Will await and follow up.

## 2023-12-28 ENCOUNTER — Ambulatory Visit (INDEPENDENT_AMBULATORY_CARE_PROVIDER_SITE_OTHER): Payer: Self-pay | Admitting: *Deleted

## 2023-12-28 DIAGNOSIS — Z952 Presence of prosthetic heart valve: Secondary | ICD-10-CM | POA: Diagnosis not present

## 2023-12-28 DIAGNOSIS — Z5181 Encounter for therapeutic drug level monitoring: Secondary | ICD-10-CM

## 2023-12-28 LAB — POCT INR: INR: 3.4 — AB (ref 2.0–3.0)

## 2023-12-28 NOTE — Patient Instructions (Signed)
Description   Spoke with pt and advised to continue taking warfarin alternating dose of 7.5mg  and 11.25mg  daily. Recheck INR in 2 weeks-Self Tester (test on Monday night & call Tuesday). Call the Anticoagulation Clinic at [phone] with any new meds or upcoming procedures.

## 2024-01-11 ENCOUNTER — Ambulatory Visit (INDEPENDENT_AMBULATORY_CARE_PROVIDER_SITE_OTHER): Payer: Self-pay | Admitting: *Deleted

## 2024-01-11 ENCOUNTER — Telehealth: Payer: Self-pay

## 2024-01-11 DIAGNOSIS — Z5181 Encounter for therapeutic drug level monitoring: Secondary | ICD-10-CM

## 2024-01-11 DIAGNOSIS — Z952 Presence of prosthetic heart valve: Secondary | ICD-10-CM | POA: Diagnosis not present

## 2024-01-11 LAB — POCT INR: INR: 2.6 (ref 2.0–3.0)

## 2024-01-11 NOTE — Telephone Encounter (Signed)
 Lpm to check INR

## 2024-01-11 NOTE — Patient Instructions (Signed)
Description   Spoke with pt and advised to continue taking warfarin alternating dose of 7.5mg  and 11.25mg  daily. Recheck INR in 2 weeks-Self Tester (test on Monday night & call Tuesday). Call the Anticoagulation Clinic at [phone] with any new meds or upcoming procedures.

## 2024-01-25 ENCOUNTER — Ambulatory Visit (INDEPENDENT_AMBULATORY_CARE_PROVIDER_SITE_OTHER): Admitting: *Deleted

## 2024-01-25 DIAGNOSIS — Z5181 Encounter for therapeutic drug level monitoring: Secondary | ICD-10-CM

## 2024-01-25 DIAGNOSIS — Z952 Presence of prosthetic heart valve: Secondary | ICD-10-CM | POA: Diagnosis not present

## 2024-01-25 LAB — POCT INR: INR: 3.4 — AB (ref 2.0–3.0)

## 2024-01-25 NOTE — Patient Instructions (Signed)
Description   Spoke with pt and advised to continue taking warfarin alternating dose of 7.5mg  and 11.25mg  daily. Recheck INR in 2 weeks-Self Tester (test on Monday night & call Tuesday). Call the Anticoagulation Clinic at [phone] with any new meds or upcoming procedures.

## 2024-02-01 ENCOUNTER — Encounter: Payer: Self-pay | Admitting: Cardiology

## 2024-02-08 ENCOUNTER — Ambulatory Visit (INDEPENDENT_AMBULATORY_CARE_PROVIDER_SITE_OTHER): Payer: Self-pay

## 2024-02-08 ENCOUNTER — Telehealth: Payer: Self-pay

## 2024-02-08 DIAGNOSIS — Z7901 Long term (current) use of anticoagulants: Secondary | ICD-10-CM

## 2024-02-08 DIAGNOSIS — Z5181 Encounter for therapeutic drug level monitoring: Secondary | ICD-10-CM | POA: Diagnosis not present

## 2024-02-08 DIAGNOSIS — Z952 Presence of prosthetic heart valve: Secondary | ICD-10-CM

## 2024-02-08 LAB — POCT INR: INR: 3.4 — AB (ref 2.0–3.0)

## 2024-02-08 NOTE — Telephone Encounter (Signed)
 Lpm to check INR

## 2024-02-08 NOTE — Patient Instructions (Signed)
Description   Spoke with pt and advised to continue taking warfarin alternating dose of 7.5mg  and 11.25mg  daily. Recheck INR in 2 weeks-Self Tester (test on Monday night & call Tuesday). Call the Anticoagulation Clinic at [phone] with any new meds or upcoming procedures.

## 2024-02-21 ENCOUNTER — Telehealth: Payer: Self-pay | Admitting: *Deleted

## 2024-02-21 NOTE — Telephone Encounter (Signed)
 Called pt to remind her to perform INR testing tonight and that we would follow up tomorrow.

## 2024-02-22 ENCOUNTER — Telehealth: Payer: Self-pay | Admitting: *Deleted

## 2024-02-22 LAB — POCT INR: INR: 3.3 — AB (ref 2.0–3.0)

## 2024-02-22 NOTE — Telephone Encounter (Signed)
Called pt since INR is due; there was no answer so left her a message to call back.

## 2024-02-23 ENCOUNTER — Ambulatory Visit (INDEPENDENT_AMBULATORY_CARE_PROVIDER_SITE_OTHER): Payer: Self-pay | Admitting: *Deleted

## 2024-02-23 ENCOUNTER — Telehealth: Payer: Self-pay | Admitting: *Deleted

## 2024-02-23 DIAGNOSIS — Z5181 Encounter for therapeutic drug level monitoring: Secondary | ICD-10-CM | POA: Diagnosis not present

## 2024-02-23 DIAGNOSIS — Z952 Presence of prosthetic heart valve: Secondary | ICD-10-CM

## 2024-02-23 NOTE — Telephone Encounter (Signed)
 Left a message for the pt to call back regarding her INR being overdue.

## 2024-02-23 NOTE — Patient Instructions (Signed)
Description   Spoke with pt and advised to continue taking warfarin alternating dose of 7.5mg  and 11.25mg  daily. Recheck INR in 2 weeks-Self Tester (test on Monday night & call Tuesday). Call the Anticoagulation Clinic at [phone] with any new meds or upcoming procedures.

## 2024-03-06 LAB — POCT INR: INR: 2.6 (ref 2.0–3.0)

## 2024-03-07 ENCOUNTER — Ambulatory Visit (INDEPENDENT_AMBULATORY_CARE_PROVIDER_SITE_OTHER): Admitting: Cardiovascular Disease

## 2024-03-07 ENCOUNTER — Telehealth: Payer: Self-pay

## 2024-03-07 DIAGNOSIS — Z5181 Encounter for therapeutic drug level monitoring: Secondary | ICD-10-CM

## 2024-03-07 DIAGNOSIS — Z952 Presence of prosthetic heart valve: Secondary | ICD-10-CM

## 2024-03-07 NOTE — Telephone Encounter (Signed)
 Lpm to check INR

## 2024-03-07 NOTE — Progress Notes (Signed)
Please see anticoagulation encounter.

## 2024-03-20 LAB — POCT INR: INR: 2.8 (ref 2.0–3.0)

## 2024-03-21 ENCOUNTER — Ambulatory Visit (INDEPENDENT_AMBULATORY_CARE_PROVIDER_SITE_OTHER): Admitting: *Deleted

## 2024-03-21 DIAGNOSIS — Z952 Presence of prosthetic heart valve: Secondary | ICD-10-CM

## 2024-03-21 DIAGNOSIS — Z5181 Encounter for therapeutic drug level monitoring: Secondary | ICD-10-CM

## 2024-03-21 NOTE — Patient Instructions (Signed)
Description   Spoke with pt and advised to continue taking warfarin alternating dose of 7.5mg  and 11.25mg  daily. Recheck INR in 2 weeks-Self Tester (test on Monday night & call Tuesday). Call the Anticoagulation Clinic at [phone] with any new meds or upcoming procedures.

## 2024-03-21 NOTE — Progress Notes (Signed)
Please see anticoagulation encounter.

## 2024-04-04 ENCOUNTER — Telehealth: Payer: Self-pay | Admitting: *Deleted

## 2024-04-04 ENCOUNTER — Ambulatory Visit (INDEPENDENT_AMBULATORY_CARE_PROVIDER_SITE_OTHER): Admitting: Internal Medicine

## 2024-04-04 DIAGNOSIS — Z952 Presence of prosthetic heart valve: Secondary | ICD-10-CM

## 2024-04-04 DIAGNOSIS — Z5181 Encounter for therapeutic drug level monitoring: Secondary | ICD-10-CM | POA: Diagnosis not present

## 2024-04-04 LAB — POCT INR: INR: 3 (ref 2.0–3.0)

## 2024-04-04 NOTE — Progress Notes (Signed)
 INR 3.0; Please see anticoagulation encounter

## 2024-04-04 NOTE — Telephone Encounter (Signed)
 Called pt since INR is due, there was no answer so left a message to call back regarding this.

## 2024-04-17 LAB — POCT INR: INR: 3 (ref 2.0–3.0)

## 2024-04-18 ENCOUNTER — Ambulatory Visit (INDEPENDENT_AMBULATORY_CARE_PROVIDER_SITE_OTHER): Admitting: *Deleted

## 2024-04-18 DIAGNOSIS — Z5181 Encounter for therapeutic drug level monitoring: Secondary | ICD-10-CM | POA: Diagnosis not present

## 2024-04-18 DIAGNOSIS — Z952 Presence of prosthetic heart valve: Secondary | ICD-10-CM

## 2024-04-18 NOTE — Progress Notes (Addendum)
 INR 3.0; Please see anticoagulation encounter

## 2024-04-30 ENCOUNTER — Other Ambulatory Visit: Payer: Self-pay | Admitting: Cardiology

## 2024-04-30 DIAGNOSIS — Z5181 Encounter for therapeutic drug level monitoring: Secondary | ICD-10-CM

## 2024-04-30 DIAGNOSIS — Z952 Presence of prosthetic heart valve: Secondary | ICD-10-CM

## 2024-05-02 ENCOUNTER — Ambulatory Visit (INDEPENDENT_AMBULATORY_CARE_PROVIDER_SITE_OTHER): Admitting: Cardiology

## 2024-05-02 DIAGNOSIS — Z5181 Encounter for therapeutic drug level monitoring: Secondary | ICD-10-CM | POA: Diagnosis not present

## 2024-05-02 DIAGNOSIS — Z952 Presence of prosthetic heart valve: Secondary | ICD-10-CM

## 2024-05-02 LAB — POCT INR: INR: 3.2 — AB (ref 2.0–3.0)

## 2024-05-02 NOTE — Progress Notes (Signed)
 INR 3.2 Please see anticoagulation encounter   Spoke with pt and advised to continue taking warfarin alternating dose of 7.5mg  and 11.25mg  daily. Recheck INR in 2 weeks-Self Tester (test on Monday night & call Tuesday). Call the Anticoagulation Clinic at [phone] with any new meds or upcoming procedures.

## 2024-05-16 ENCOUNTER — Ambulatory Visit (INDEPENDENT_AMBULATORY_CARE_PROVIDER_SITE_OTHER): Payer: Self-pay | Admitting: Cardiology

## 2024-05-16 ENCOUNTER — Telehealth: Payer: Self-pay | Admitting: *Deleted

## 2024-05-16 DIAGNOSIS — Z952 Presence of prosthetic heart valve: Secondary | ICD-10-CM

## 2024-05-16 DIAGNOSIS — Z5181 Encounter for therapeutic drug level monitoring: Secondary | ICD-10-CM

## 2024-05-16 LAB — POCT INR: INR: 2.9 (ref 2.0–3.0)

## 2024-05-16 NOTE — Telephone Encounter (Signed)
 Called patient since INR is due; there was no answer so left her a message to call back regarding this.

## 2024-05-29 LAB — POCT INR: INR: 3.4 — AB (ref 2.0–3.0)

## 2024-05-30 ENCOUNTER — Telehealth: Payer: Self-pay | Admitting: *Deleted

## 2024-05-30 ENCOUNTER — Ambulatory Visit (INDEPENDENT_AMBULATORY_CARE_PROVIDER_SITE_OTHER): Admitting: *Deleted

## 2024-05-30 DIAGNOSIS — Z952 Presence of prosthetic heart valve: Secondary | ICD-10-CM

## 2024-05-30 DIAGNOSIS — Z5181 Encounter for therapeutic drug level monitoring: Secondary | ICD-10-CM | POA: Diagnosis not present

## 2024-05-30 NOTE — Patient Instructions (Signed)
 Description   INR-3.4; Spoke with pt and advised to continue taking warfarin alternating dose of 7.5mg  and 11.25mg  daily. Recheck INR in 2 weeks-Self Tester (test on Monday night & call Tuesday). Call the Anticoagulation Clinic at [phone] with any new meds or upcoming procedures.

## 2024-05-30 NOTE — Telephone Encounter (Signed)
 Called patient since INR is due; there was no answer so left her a message to call back regarding this.

## 2024-05-30 NOTE — Progress Notes (Signed)
 Description   INR-3.4; Spoke with pt and advised to continue taking warfarin alternating dose of 7.5mg  and 11.25mg  daily. Recheck INR in 2 weeks-Self Tester (test on Monday night & call Tuesday). Call the Anticoagulation Clinic at [phone] with any new meds or upcoming procedures.

## 2024-06-01 ENCOUNTER — Other Ambulatory Visit: Payer: Self-pay | Admitting: *Deleted

## 2024-06-01 DIAGNOSIS — Z952 Presence of prosthetic heart valve: Secondary | ICD-10-CM

## 2024-06-01 DIAGNOSIS — Z5181 Encounter for therapeutic drug level monitoring: Secondary | ICD-10-CM

## 2024-06-01 MED ORDER — WARFARIN SODIUM 7.5 MG PO TABS: ORAL_TABLET | ORAL | 0 refills | Status: DC

## 2024-06-01 NOTE — Telephone Encounter (Signed)
 Prescription refill request received for warfarin Lov: 10/19/2023 Next INR check: 9/30 Warfarin tablet strength: 7.5 mg

## 2024-06-08 ENCOUNTER — Other Ambulatory Visit: Payer: Self-pay | Admitting: Cardiology

## 2024-06-12 LAB — POCT INR: INR: 3.2 — AB (ref 2.0–3.0)

## 2024-06-13 ENCOUNTER — Ambulatory Visit (INDEPENDENT_AMBULATORY_CARE_PROVIDER_SITE_OTHER): Admitting: *Deleted

## 2024-06-13 DIAGNOSIS — Z5181 Encounter for therapeutic drug level monitoring: Secondary | ICD-10-CM

## 2024-06-13 DIAGNOSIS — Z952 Presence of prosthetic heart valve: Secondary | ICD-10-CM | POA: Diagnosis not present

## 2024-06-13 NOTE — Progress Notes (Signed)
 Description   INR-3.2; REMIND HER TO REPORT RESULTS TO COMPANY. Spoke with pt and advised to continue taking warfarin alternating dose of 7.5mg  and 11.25mg  daily. Recheck INR in 2 weeks-Self Tester (test on Monday night & call Tuesday). Call the Anticoagulation Clinic at [phone] with any new meds or upcoming procedures.

## 2024-06-13 NOTE — Patient Instructions (Signed)
 Description   INR-3.2; REMIND HER TO REPORT RESULTS TO COMPANY. Spoke with pt and advised to continue taking warfarin alternating dose of 7.5mg  and 11.25mg  daily. Recheck INR in 2 weeks-Self Tester (test on Monday night & call Tuesday). Call the Anticoagulation Clinic at [phone] with any new meds or upcoming procedures.

## 2024-06-27 ENCOUNTER — Ambulatory Visit (INDEPENDENT_AMBULATORY_CARE_PROVIDER_SITE_OTHER): Admitting: *Deleted

## 2024-06-27 ENCOUNTER — Telehealth: Payer: Self-pay | Admitting: *Deleted

## 2024-06-27 DIAGNOSIS — Z5181 Encounter for therapeutic drug level monitoring: Secondary | ICD-10-CM

## 2024-06-27 DIAGNOSIS — Z952 Presence of prosthetic heart valve: Secondary | ICD-10-CM | POA: Diagnosis not present

## 2024-06-27 LAB — POCT INR: INR: 3.5 — AB (ref 2.0–3.0)

## 2024-06-27 NOTE — Telephone Encounter (Signed)
 Called patient since INR is due; there was no answer so left a message to call back regarding this.

## 2024-06-27 NOTE — Progress Notes (Signed)
 Description   INR-3.2; REMIND HER TO REPORT RESULTS TO COMPANY. Spoke with pt and advised to continue taking warfarin alternating dose of 7.5mg  and 11.25mg  daily. Recheck INR in 2 weeks-Self Tester (test on Monday night & call Tuesday). Call the Anticoagulation Clinic at [phone] with any new meds or upcoming procedures.

## 2024-06-27 NOTE — Patient Instructions (Signed)
 Description   INR-3.2; REMIND HER TO REPORT RESULTS TO COMPANY. Spoke with pt and advised to continue taking warfarin alternating dose of 7.5mg  and 11.25mg  daily. Recheck INR in 2 weeks-Self Tester (test on Monday night & call Tuesday). Call the Anticoagulation Clinic at [phone] with any new meds or upcoming procedures.

## 2024-07-10 LAB — POCT INR: INR: 3.2 — AB (ref 2.0–3.0)

## 2024-07-11 ENCOUNTER — Ambulatory Visit (INDEPENDENT_AMBULATORY_CARE_PROVIDER_SITE_OTHER): Admitting: *Deleted

## 2024-07-11 DIAGNOSIS — Z952 Presence of prosthetic heart valve: Secondary | ICD-10-CM

## 2024-07-11 DIAGNOSIS — Z5181 Encounter for therapeutic drug level monitoring: Secondary | ICD-10-CM

## 2024-07-11 NOTE — Patient Instructions (Signed)
 Description   INR-3.2; REMIND HER TO REPORT RESULTS TO COMPANY. Spoke with pt and advised to continue taking warfarin alternating dose of 7.5mg  and 11.25mg  daily. Recheck INR in 2 weeks-Self Tester (test on Monday night & call Tuesday). Call the Anticoagulation Clinic at [phone] with any new meds or upcoming procedures.

## 2024-07-11 NOTE — Progress Notes (Signed)
 Description   INR-3.2; REMIND HER TO REPORT RESULTS TO COMPANY. Spoke with pt and advised to continue taking warfarin alternating dose of 7.5mg  and 11.25mg  daily. Recheck INR in 2 weeks-Self Tester (test on Monday night & call Tuesday). Call the Anticoagulation Clinic at [phone] with any new meds or upcoming procedures.

## 2024-07-25 ENCOUNTER — Telehealth: Payer: Self-pay | Admitting: *Deleted

## 2024-07-25 ENCOUNTER — Ambulatory Visit (INDEPENDENT_AMBULATORY_CARE_PROVIDER_SITE_OTHER)

## 2024-07-25 DIAGNOSIS — Z952 Presence of prosthetic heart valve: Secondary | ICD-10-CM | POA: Diagnosis not present

## 2024-07-25 DIAGNOSIS — Z5181 Encounter for therapeutic drug level monitoring: Secondary | ICD-10-CM | POA: Diagnosis not present

## 2024-07-25 LAB — POCT INR: INR: 3.3 — AB (ref 2.0–3.0)

## 2024-07-25 NOTE — Telephone Encounter (Signed)
 Called patient since INR is due; there was no answer so left a message for her to return the call.

## 2024-08-08 ENCOUNTER — Telehealth: Payer: Self-pay | Admitting: *Deleted

## 2024-08-08 ENCOUNTER — Ambulatory Visit (INDEPENDENT_AMBULATORY_CARE_PROVIDER_SITE_OTHER): Admitting: *Deleted

## 2024-08-08 DIAGNOSIS — Z952 Presence of prosthetic heart valve: Secondary | ICD-10-CM | POA: Diagnosis not present

## 2024-08-08 DIAGNOSIS — Z5181 Encounter for therapeutic drug level monitoring: Secondary | ICD-10-CM | POA: Diagnosis not present

## 2024-08-08 LAB — POCT INR: INR: 3.1 — AB (ref 2.0–3.0)

## 2024-08-08 NOTE — Telephone Encounter (Signed)
 Called patient since INR is due; there was no answer so left a message.

## 2024-08-08 NOTE — Patient Instructions (Signed)
 Description   REMIND HER TO REPORT RESULTS TO COMPANY. INR-3.1; Spoke with pt and advised to continue taking warfarin alternating dose of 7.5mg  and 11.25mg  daily. Recheck INR in 2 weeks-Self Tester (test on Monday night & call Tuesday). Call the Anticoagulation Clinic at [phone] with any new meds or upcoming procedures.

## 2024-08-08 NOTE — Progress Notes (Signed)
 Description   REMIND HER TO REPORT RESULTS TO COMPANY. INR-3.1; Spoke with pt and advised to continue taking warfarin alternating dose of 7.5mg  and 11.25mg  daily. Recheck INR in 2 weeks-Self Tester (test on Monday night & call Tuesday). Call the Anticoagulation Clinic at [phone] with any new meds or upcoming procedures.

## 2024-08-22 ENCOUNTER — Ambulatory Visit (INDEPENDENT_AMBULATORY_CARE_PROVIDER_SITE_OTHER)

## 2024-08-22 ENCOUNTER — Telehealth: Payer: Self-pay | Admitting: *Deleted

## 2024-08-22 DIAGNOSIS — Z952 Presence of prosthetic heart valve: Secondary | ICD-10-CM

## 2024-08-22 DIAGNOSIS — Z5181 Encounter for therapeutic drug level monitoring: Secondary | ICD-10-CM

## 2024-08-22 LAB — POCT INR: INR: 2.7 (ref 2.0–3.0)

## 2024-08-22 NOTE — Telephone Encounter (Signed)
 Called patient since INR is due; there was no answer so left her a message.

## 2024-08-22 NOTE — Progress Notes (Signed)
 Description   REMIND HER TO REPORT RESULTS TO COMPANY. INR-2.7; Spoke with pt and advised to continue taking warfarin alternating dose of 7.5mg  and 11.25mg  daily. Recheck INR in 2 weeks-Self Tester (test on Monday night & call Tuesday). Call the Anticoagulation Clinic at [phone] with any new meds or upcoming procedures.

## 2024-08-22 NOTE — Patient Instructions (Signed)
 Description   REMIND HER TO REPORT RESULTS TO COMPANY. INR-2.7; Spoke with pt and advised to continue taking warfarin alternating dose of 7.5mg  and 11.25mg  daily. Recheck INR in 2 weeks-Self Tester (test on Monday night & call Tuesday). Call the Anticoagulation Clinic at [phone] with any new meds or upcoming procedures.

## 2024-09-05 ENCOUNTER — Ambulatory Visit (INDEPENDENT_AMBULATORY_CARE_PROVIDER_SITE_OTHER): Payer: Self-pay

## 2024-09-05 ENCOUNTER — Other Ambulatory Visit: Payer: Self-pay

## 2024-09-05 DIAGNOSIS — Z5181 Encounter for therapeutic drug level monitoring: Secondary | ICD-10-CM

## 2024-09-05 DIAGNOSIS — Z952 Presence of prosthetic heart valve: Secondary | ICD-10-CM

## 2024-09-05 LAB — POCT INR: INR: 2.9 (ref 2.0–3.0)

## 2024-09-05 MED ORDER — WARFARIN SODIUM 7.5 MG PO TABS: ORAL_TABLET | ORAL | 0 refills | Status: AC

## 2024-09-18 LAB — POCT INR: INR: 3.1 — AB (ref 2.0–3.0)

## 2024-09-19 ENCOUNTER — Telehealth: Payer: Self-pay | Admitting: *Deleted

## 2024-09-19 ENCOUNTER — Ambulatory Visit (INDEPENDENT_AMBULATORY_CARE_PROVIDER_SITE_OTHER): Admitting: *Deleted

## 2024-09-19 DIAGNOSIS — Z5181 Encounter for therapeutic drug level monitoring: Secondary | ICD-10-CM | POA: Diagnosis not present

## 2024-09-19 DIAGNOSIS — Z952 Presence of prosthetic heart valve: Secondary | ICD-10-CM | POA: Diagnosis not present

## 2024-09-19 NOTE — Progress Notes (Signed)
 Description   REMIND HER TO REPORT RESULTS TO COMPANY. INR-3.1; Spoke with pt and advised to continue taking warfarin alternating dose of 7.5mg  and 11.25mg  daily. Recheck INR in 2 weeks-Self Tester (test on Monday night & call Tuesday). Call the Anticoagulation Clinic at [phone] with any new meds or upcoming procedures.

## 2024-09-19 NOTE — Patient Instructions (Signed)
 Description   REMIND HER TO REPORT RESULTS TO COMPANY. INR-3.1; Spoke with pt and advised to continue taking warfarin alternating dose of 7.5mg  and 11.25mg  daily. Recheck INR in 2 weeks-Self Tester (test on Monday night & call Tuesday). Call the Anticoagulation Clinic at [phone] with any new meds or upcoming procedures.

## 2024-09-19 NOTE — Telephone Encounter (Signed)
 Left message for patient to call back regarding INR, which is due today.

## 2024-09-25 ENCOUNTER — Other Ambulatory Visit: Payer: Self-pay | Admitting: Cardiology

## 2024-10-03 ENCOUNTER — Ambulatory Visit (INDEPENDENT_AMBULATORY_CARE_PROVIDER_SITE_OTHER): Admitting: Pharmacist

## 2024-10-03 ENCOUNTER — Telehealth: Payer: Self-pay | Admitting: *Deleted

## 2024-10-03 DIAGNOSIS — Z952 Presence of prosthetic heart valve: Secondary | ICD-10-CM | POA: Diagnosis not present

## 2024-10-03 DIAGNOSIS — Z5181 Encounter for therapeutic drug level monitoring: Secondary | ICD-10-CM

## 2024-10-03 LAB — POCT INR: INR: 2.7 (ref 2.0–3.0)

## 2024-10-03 NOTE — Progress Notes (Signed)
 Description   REMIND HER TO REPORT RESULTS TO COMPANY. INR-2.7; Spoke with pt and advised to continue taking warfarin alternating dose of 7.5mg  and 11.25mg  daily. Recheck INR in 2 weeks-Self Tester (test on Monday night & call Tuesday). Call the Anticoagulation Clinic at [phone] with any new meds or upcoming procedures.

## 2024-10-03 NOTE — Telephone Encounter (Signed)
 Called patient since INR is due. There was no answer so left her a message to call back.

## 2024-10-03 NOTE — Patient Instructions (Signed)
 Description   REMIND HER TO REPORT RESULTS TO COMPANY. INR-2.7; Spoke with pt and advised to continue taking warfarin alternating dose of 7.5mg  and 11.25mg  daily. Recheck INR in 2 weeks-Self Tester (test on Monday night & call Tuesday). Call the Anticoagulation Clinic at [phone] with any new meds or upcoming procedures.

## 2024-10-10 ENCOUNTER — Other Ambulatory Visit: Payer: Self-pay | Admitting: Obstetrics and Gynecology

## 2024-10-10 DIAGNOSIS — Z1231 Encounter for screening mammogram for malignant neoplasm of breast: Secondary | ICD-10-CM

## 2024-10-17 ENCOUNTER — Ambulatory Visit (INDEPENDENT_AMBULATORY_CARE_PROVIDER_SITE_OTHER): Admitting: *Deleted

## 2024-10-17 ENCOUNTER — Telehealth: Payer: Self-pay | Admitting: *Deleted

## 2024-10-17 DIAGNOSIS — Z952 Presence of prosthetic heart valve: Secondary | ICD-10-CM | POA: Diagnosis not present

## 2024-10-17 DIAGNOSIS — Z5181 Encounter for therapeutic drug level monitoring: Secondary | ICD-10-CM

## 2024-10-17 LAB — POCT INR: INR: 3.1 — AB (ref 2.0–3.0)

## 2024-10-17 NOTE — Patient Instructions (Signed)
 Description   REMIND HER TO REPORT RESULTS TO COMPANY. INR-3.1; Spoke with pt and advised to continue taking warfarin alternating dose of 7.5mg  and 11.25mg  daily. Recheck INR in 2 weeks-Self Tester (test on Monday night & call Tuesday). Call the Anticoagulation Clinic at [phone] with any new meds or upcoming procedures.

## 2024-10-17 NOTE — Telephone Encounter (Signed)
 Left message for the patient to call back regarding INR being due today.

## 2024-10-17 NOTE — Progress Notes (Signed)
 Description   REMIND HER TO REPORT RESULTS TO COMPANY. INR-3.1; Spoke with pt and advised to continue taking warfarin alternating dose of 7.5mg  and 11.25mg  daily. Recheck INR in 2 weeks-Self Tester (test on Monday night & call Tuesday). Call the Anticoagulation Clinic at [phone] with any new meds or upcoming procedures.

## 2024-11-20 ENCOUNTER — Ambulatory Visit
